# Patient Record
Sex: Male | Born: 1935 | Race: White | Hispanic: No | Marital: Married | Smoking: Former smoker
Health system: Southern US, Community
[De-identification: ages and names within clinical notes are randomized; demographics above are authoritative.]

## PROBLEM LIST (undated history)

## (undated) DIAGNOSIS — I350 Nonrheumatic aortic (valve) stenosis: Secondary | ICD-10-CM

## (undated) DIAGNOSIS — J449 Chronic obstructive pulmonary disease, unspecified: Secondary | ICD-10-CM

## (undated) DIAGNOSIS — I491 Atrial premature depolarization: Secondary | ICD-10-CM

## (undated) DIAGNOSIS — E2740 Unspecified adrenocortical insufficiency: Secondary | ICD-10-CM

## (undated) DIAGNOSIS — Z77090 Contact with and (suspected) exposure to asbestos: Secondary | ICD-10-CM

## (undated) HISTORY — PX: TONSILLECTOMY: SHX5217

## (undated) HISTORY — DX: Nonrheumatic aortic (valve) stenosis: I35.0

## (undated) HISTORY — DX: Atrial premature depolarization: I49.1

## (undated) HISTORY — PX: HERNIA REPAIR: SHX51

## (undated) HISTORY — DX: Chronic obstructive pulmonary disease, unspecified: J44.9

## (undated) HISTORY — DX: Unspecified adrenocortical insufficiency: E27.40

## (undated) HISTORY — DX: Contact with and (suspected) exposure to asbestos: Z77.090

---

## 1997-10-31 ENCOUNTER — Ambulatory Visit (HOSPITAL_COMMUNITY): Admission: RE | Admit: 1997-10-31 | Discharge: 1997-10-31 | Payer: Self-pay | Admitting: Internal Medicine

## 1998-01-25 ENCOUNTER — Other Ambulatory Visit: Admission: RE | Admit: 1998-01-25 | Discharge: 1998-01-25 | Payer: Self-pay | Admitting: Internal Medicine

## 1999-01-16 ENCOUNTER — Observation Stay (HOSPITAL_COMMUNITY): Admission: AD | Admit: 1999-01-16 | Discharge: 1999-01-17 | Payer: Self-pay | Admitting: Interventional Cardiology

## 2000-01-28 ENCOUNTER — Ambulatory Visit (HOSPITAL_COMMUNITY): Admission: RE | Admit: 2000-01-28 | Discharge: 2000-01-28 | Payer: Self-pay | Admitting: Internal Medicine

## 2000-11-13 ENCOUNTER — Encounter: Admission: RE | Admit: 2000-11-13 | Discharge: 2000-11-13 | Payer: Self-pay | Admitting: Gastroenterology

## 2000-11-13 ENCOUNTER — Encounter: Payer: Self-pay | Admitting: Gastroenterology

## 2000-12-03 ENCOUNTER — Encounter: Admission: RE | Admit: 2000-12-03 | Discharge: 2000-12-03 | Payer: Self-pay | Admitting: Surgery

## 2000-12-03 ENCOUNTER — Encounter: Payer: Self-pay | Admitting: Surgery

## 2000-12-04 ENCOUNTER — Ambulatory Visit (HOSPITAL_BASED_OUTPATIENT_CLINIC_OR_DEPARTMENT_OTHER): Admission: RE | Admit: 2000-12-04 | Discharge: 2000-12-04 | Payer: Self-pay | Admitting: Surgery

## 2001-01-09 ENCOUNTER — Encounter: Payer: Self-pay | Admitting: Emergency Medicine

## 2001-01-09 ENCOUNTER — Emergency Department (HOSPITAL_COMMUNITY): Admission: EM | Admit: 2001-01-09 | Discharge: 2001-01-09 | Payer: Self-pay | Admitting: Emergency Medicine

## 2001-07-01 ENCOUNTER — Encounter: Admission: RE | Admit: 2001-07-01 | Discharge: 2001-07-01 | Payer: Self-pay | Admitting: Internal Medicine

## 2001-07-01 ENCOUNTER — Encounter: Payer: Self-pay | Admitting: Internal Medicine

## 2003-01-13 ENCOUNTER — Encounter: Payer: Self-pay | Admitting: *Deleted

## 2003-01-13 ENCOUNTER — Emergency Department (HOSPITAL_COMMUNITY): Admission: EM | Admit: 2003-01-13 | Discharge: 2003-01-13 | Payer: Self-pay | Admitting: *Deleted

## 2003-01-19 ENCOUNTER — Encounter: Payer: Self-pay | Admitting: Gastroenterology

## 2003-01-19 ENCOUNTER — Encounter: Admission: RE | Admit: 2003-01-19 | Discharge: 2003-01-19 | Payer: Self-pay | Admitting: Gastroenterology

## 2004-08-08 ENCOUNTER — Ambulatory Visit: Payer: Self-pay | Admitting: Internal Medicine

## 2004-08-21 ENCOUNTER — Encounter: Admission: RE | Admit: 2004-08-21 | Discharge: 2004-08-21 | Payer: Self-pay | Admitting: Neurology

## 2004-11-11 ENCOUNTER — Ambulatory Visit: Payer: Self-pay | Admitting: Internal Medicine

## 2004-12-10 ENCOUNTER — Ambulatory Visit: Payer: Self-pay | Admitting: Internal Medicine

## 2005-01-28 ENCOUNTER — Ambulatory Visit: Payer: Self-pay | Admitting: Internal Medicine

## 2005-06-06 ENCOUNTER — Ambulatory Visit: Payer: Self-pay | Admitting: Internal Medicine

## 2005-08-08 ENCOUNTER — Ambulatory Visit: Payer: Self-pay | Admitting: Internal Medicine

## 2005-11-03 ENCOUNTER — Ambulatory Visit: Payer: Self-pay | Admitting: Internal Medicine

## 2005-11-18 ENCOUNTER — Ambulatory Visit: Payer: Self-pay | Admitting: Internal Medicine

## 2005-12-02 ENCOUNTER — Ambulatory Visit: Payer: Self-pay | Admitting: Critical Care Medicine

## 2005-12-16 ENCOUNTER — Ambulatory Visit: Payer: Self-pay | Admitting: Internal Medicine

## 2005-12-30 ENCOUNTER — Ambulatory Visit: Payer: Self-pay | Admitting: Internal Medicine

## 2006-01-13 ENCOUNTER — Ambulatory Visit: Payer: Self-pay | Admitting: Internal Medicine

## 2006-01-27 ENCOUNTER — Ambulatory Visit: Payer: Self-pay | Admitting: Internal Medicine

## 2006-02-02 ENCOUNTER — Ambulatory Visit: Payer: Self-pay | Admitting: Internal Medicine

## 2006-02-10 ENCOUNTER — Ambulatory Visit: Payer: Self-pay | Admitting: Internal Medicine

## 2006-03-11 ENCOUNTER — Ambulatory Visit: Payer: Self-pay | Admitting: Internal Medicine

## 2006-04-08 ENCOUNTER — Ambulatory Visit: Payer: Self-pay | Admitting: Internal Medicine

## 2006-04-21 ENCOUNTER — Ambulatory Visit: Payer: Self-pay | Admitting: Internal Medicine

## 2006-05-05 ENCOUNTER — Ambulatory Visit: Payer: Self-pay | Admitting: Internal Medicine

## 2006-05-12 ENCOUNTER — Ambulatory Visit: Payer: Self-pay | Admitting: Internal Medicine

## 2006-05-19 ENCOUNTER — Ambulatory Visit: Payer: Self-pay | Admitting: Internal Medicine

## 2006-06-02 ENCOUNTER — Ambulatory Visit: Payer: Self-pay | Admitting: Internal Medicine

## 2006-06-18 ENCOUNTER — Ambulatory Visit: Payer: Self-pay | Admitting: Internal Medicine

## 2006-08-07 ENCOUNTER — Encounter: Payer: Self-pay | Admitting: Internal Medicine

## 2006-08-14 ENCOUNTER — Ambulatory Visit: Payer: Self-pay | Admitting: Internal Medicine

## 2006-08-24 ENCOUNTER — Ambulatory Visit: Payer: Self-pay | Admitting: Internal Medicine

## 2006-09-07 ENCOUNTER — Ambulatory Visit: Payer: Self-pay | Admitting: Internal Medicine

## 2006-09-21 ENCOUNTER — Ambulatory Visit: Payer: Self-pay | Admitting: Internal Medicine

## 2006-10-05 ENCOUNTER — Ambulatory Visit: Payer: Self-pay | Admitting: Internal Medicine

## 2006-10-13 ENCOUNTER — Ambulatory Visit: Payer: Self-pay | Admitting: Internal Medicine

## 2006-10-19 ENCOUNTER — Ambulatory Visit: Payer: Self-pay | Admitting: Internal Medicine

## 2006-11-02 ENCOUNTER — Ambulatory Visit: Payer: Self-pay | Admitting: Internal Medicine

## 2006-11-17 ENCOUNTER — Ambulatory Visit: Payer: Self-pay | Admitting: Internal Medicine

## 2006-12-01 ENCOUNTER — Ambulatory Visit: Payer: Self-pay | Admitting: Internal Medicine

## 2006-12-08 ENCOUNTER — Ambulatory Visit: Payer: Self-pay | Admitting: Internal Medicine

## 2006-12-16 ENCOUNTER — Ambulatory Visit: Payer: Self-pay | Admitting: Internal Medicine

## 2007-01-04 ENCOUNTER — Ambulatory Visit: Payer: Self-pay | Admitting: Internal Medicine

## 2007-01-18 ENCOUNTER — Ambulatory Visit: Payer: Self-pay | Admitting: Internal Medicine

## 2007-02-01 ENCOUNTER — Ambulatory Visit: Payer: Self-pay | Admitting: Internal Medicine

## 2007-02-16 ENCOUNTER — Ambulatory Visit: Payer: Self-pay | Admitting: Internal Medicine

## 2007-03-02 ENCOUNTER — Ambulatory Visit: Payer: Self-pay | Admitting: Internal Medicine

## 2007-03-16 ENCOUNTER — Ambulatory Visit: Payer: Self-pay | Admitting: Internal Medicine

## 2007-05-11 ENCOUNTER — Ambulatory Visit: Payer: Self-pay | Admitting: Internal Medicine

## 2007-05-20 ENCOUNTER — Telehealth: Payer: Self-pay | Admitting: Internal Medicine

## 2007-05-25 ENCOUNTER — Ambulatory Visit: Payer: Self-pay | Admitting: Internal Medicine

## 2007-06-07 ENCOUNTER — Ambulatory Visit: Payer: Self-pay | Admitting: Internal Medicine

## 2007-06-08 DIAGNOSIS — J4489 Other specified chronic obstructive pulmonary disease: Secondary | ICD-10-CM | POA: Insufficient documentation

## 2007-06-08 DIAGNOSIS — J309 Allergic rhinitis, unspecified: Secondary | ICD-10-CM | POA: Insufficient documentation

## 2007-06-08 DIAGNOSIS — J449 Chronic obstructive pulmonary disease, unspecified: Secondary | ICD-10-CM

## 2007-06-08 DIAGNOSIS — N259 Disorder resulting from impaired renal tubular function, unspecified: Secondary | ICD-10-CM | POA: Insufficient documentation

## 2007-06-08 DIAGNOSIS — J330 Polyp of nasal cavity: Secondary | ICD-10-CM

## 2007-06-08 DIAGNOSIS — J45909 Unspecified asthma, uncomplicated: Secondary | ICD-10-CM | POA: Insufficient documentation

## 2007-06-08 DIAGNOSIS — J329 Chronic sinusitis, unspecified: Secondary | ICD-10-CM | POA: Insufficient documentation

## 2007-06-12 DIAGNOSIS — E273 Drug-induced adrenocortical insufficiency: Secondary | ICD-10-CM

## 2007-06-21 ENCOUNTER — Ambulatory Visit: Payer: Self-pay | Admitting: Internal Medicine

## 2007-07-15 ENCOUNTER — Ambulatory Visit: Payer: Self-pay | Admitting: Internal Medicine

## 2007-08-25 ENCOUNTER — Telehealth (INDEPENDENT_AMBULATORY_CARE_PROVIDER_SITE_OTHER): Payer: Self-pay | Admitting: *Deleted

## 2007-10-13 ENCOUNTER — Telehealth: Payer: Self-pay | Admitting: Internal Medicine

## 2007-11-18 ENCOUNTER — Encounter: Payer: Self-pay | Admitting: Internal Medicine

## 2007-12-14 ENCOUNTER — Ambulatory Visit: Payer: Self-pay | Admitting: Internal Medicine

## 2008-03-23 ENCOUNTER — Encounter: Admission: RE | Admit: 2008-03-23 | Discharge: 2008-03-23 | Payer: Self-pay | Admitting: Internal Medicine

## 2008-03-30 ENCOUNTER — Encounter: Admission: RE | Admit: 2008-03-30 | Discharge: 2008-03-30 | Payer: Self-pay | Admitting: Internal Medicine

## 2008-04-06 ENCOUNTER — Encounter: Admission: RE | Admit: 2008-04-06 | Discharge: 2008-04-06 | Payer: Self-pay | Admitting: Gastroenterology

## 2008-05-09 ENCOUNTER — Ambulatory Visit: Payer: Self-pay | Admitting: Internal Medicine

## 2008-05-10 ENCOUNTER — Telehealth: Payer: Self-pay | Admitting: Internal Medicine

## 2008-05-29 ENCOUNTER — Encounter: Payer: Self-pay | Admitting: Internal Medicine

## 2008-10-03 ENCOUNTER — Ambulatory Visit: Payer: Self-pay | Admitting: Internal Medicine

## 2008-10-17 ENCOUNTER — Encounter: Payer: Self-pay | Admitting: Internal Medicine

## 2008-10-20 ENCOUNTER — Encounter: Payer: Self-pay | Admitting: Internal Medicine

## 2008-12-04 ENCOUNTER — Ambulatory Visit: Payer: Self-pay | Admitting: Internal Medicine

## 2008-12-06 DIAGNOSIS — J45909 Unspecified asthma, uncomplicated: Secondary | ICD-10-CM | POA: Insufficient documentation

## 2008-12-18 ENCOUNTER — Ambulatory Visit: Payer: Self-pay | Admitting: Internal Medicine

## 2009-01-02 ENCOUNTER — Ambulatory Visit: Payer: Self-pay | Admitting: Internal Medicine

## 2009-01-16 ENCOUNTER — Ambulatory Visit: Payer: Self-pay | Admitting: Internal Medicine

## 2009-01-30 ENCOUNTER — Ambulatory Visit: Payer: Self-pay | Admitting: Internal Medicine

## 2009-02-15 ENCOUNTER — Ambulatory Visit: Payer: Self-pay | Admitting: Internal Medicine

## 2009-02-28 ENCOUNTER — Ambulatory Visit: Payer: Self-pay | Admitting: Internal Medicine

## 2009-03-13 ENCOUNTER — Telehealth: Payer: Self-pay | Admitting: Internal Medicine

## 2009-06-04 ENCOUNTER — Ambulatory Visit: Payer: Self-pay | Admitting: Internal Medicine

## 2009-06-11 ENCOUNTER — Telehealth (INDEPENDENT_AMBULATORY_CARE_PROVIDER_SITE_OTHER): Payer: Self-pay | Admitting: *Deleted

## 2009-09-11 ENCOUNTER — Telehealth (INDEPENDENT_AMBULATORY_CARE_PROVIDER_SITE_OTHER): Payer: Self-pay | Admitting: *Deleted

## 2009-09-14 ENCOUNTER — Telehealth: Payer: Self-pay | Admitting: Internal Medicine

## 2009-11-22 ENCOUNTER — Encounter: Admission: RE | Admit: 2009-11-22 | Discharge: 2009-11-22 | Payer: Self-pay | Admitting: Internal Medicine

## 2009-11-24 ENCOUNTER — Encounter: Admission: RE | Admit: 2009-11-24 | Discharge: 2009-11-24 | Payer: Self-pay | Admitting: Internal Medicine

## 2009-12-03 ENCOUNTER — Ambulatory Visit: Payer: Self-pay | Admitting: Internal Medicine

## 2009-12-03 DIAGNOSIS — I491 Atrial premature depolarization: Secondary | ICD-10-CM

## 2010-02-04 ENCOUNTER — Ambulatory Visit: Payer: Self-pay | Admitting: Internal Medicine

## 2010-02-04 DIAGNOSIS — R0602 Shortness of breath: Secondary | ICD-10-CM | POA: Insufficient documentation

## 2010-02-04 DIAGNOSIS — T6391XA Toxic effect of contact with unspecified venomous animal, accidental (unintentional), initial encounter: Secondary | ICD-10-CM | POA: Insufficient documentation

## 2010-07-20 ENCOUNTER — Encounter: Payer: Self-pay | Admitting: Neurology

## 2010-07-30 NOTE — Miscellaneous (Signed)
Summary: Injection Record / [facility]    Injection Record / [facility]    Imported By: Lennie Odor 11/19/2009 14:44:27  _____________________________________________________________________  External Attachment:    Type:   Image     Comment:   External Document

## 2010-07-30 NOTE — Letter (Signed)
Summary: Statement of Medical Necessity/ [facility]  Statement of Medical Necessity/ [facility]   Imported By: Lennie Odor 11/19/2009 17:14:50  _____________________________________________________________________  External Attachment:    Type:   Image     Comment:   External Document

## 2010-07-30 NOTE — Progress Notes (Signed)
Summary: rx  Phone Note Call from Patient Call back at Home Phone [phone]   Caller: Spouse-Nancy Call For: young Reason for Call: Talk to Nurse Summary of Call: cough, congestion, settling in pt's chest.  Can you call in something? [facility] [address] Initial call taken by: Eugene Gavia,  September 11, 2009 3:02 PM  Follow-up for Phone Call        called and spoke with pt.  pt  states symptoms started yesterday.  pt c/o chest congestion, coughing up clear to yellow colored sputum, chest sore d/t increased coughing, head congestion with yellow nasal drainage, wheezing and increased sob.  pt denied fever.  pt states he is taking mucinex.  pt requests frx for Tussionex and abx. Please advise.  Thanks.  Aundra Millet Reynolds LPN  September 11, 2009 3:32 PM   NKDA  Additional Follow-up for Phone Call Additional follow up Details #1::        per CDY: okay for augmentin 875mg  #14 1 by mouth two times a day and tussionex 1 tsp two times a day as needed.  augmentin sent electronically and tussionex called into pt's pharmacy of choice.  pt is aware. Boone Master CNA  September 11, 2009 5:46 PM     New/Updated Medications: AUGMENTIN 875-125 MG TABS (AMOXICILLIN-POT CLAVULANATE) Take 1 tablet by mouth two times a day x7days Prescriptions: AUGMENTIN 875-125 MG TABS (AMOXICILLIN-POT CLAVULANATE) Take 1 tablet by mouth two times a day x7days  #14 x 0   Entered by:   Boone Master CNA   Authorized by:   Waymon Budge MD   Signed by:   Boone Master CNA on 09/11/2009   Method used:   Electronically to        [facility] [phone]* (retail)       [address]       [place], [place]  [address]       Ph: [phone] or [phone]       Fax: [phone]   RxID:   [id number]

## 2010-07-30 NOTE — Assessment & Plan Note (Signed)
Summary: rov 6 months///kp   Primary Provider/Referring Provider:  Burney Gauze  CC:  6 month followup.  Pt c/o SOB progressively getting worse since last seen.  He states that he gets tired easily.  He ran out of advair 6 wks ago and did not renew rx..  History of Present Illness:  12/04/08- Asthma/ COPD, asbestos exposure, adrenal insufficiency Says approval obtained for Xolair, to start today.  Main c/o is weakness, easily tired. Peak flow around 300-350. max was 450 while on heavy pednisone dose. Some wheeze and occasionally cough with white sputum. Denies chest pain, fever, edema. Feet have swollen at times in past. Balance not good- he suspects slight stroke in the past. No blood or purulent discharge.  June 04, 2009- Asthma/ COPD, asbestos exposure, adrenal insufficiency Episodes of "tiredness". Despite maintenance prednisone, he wonders if this could be adrenal insufficiency. He says Dr Donette Larry checked broad blood work "OK". He asks about a CXR. Occasional wheeze but not much. Since Xolair? coincidence? He has noted more aching in thighs and knees on  waking. Little ankle edema- rarely takes diuretic. Not having much active airway spasm- we talked about how much of his disease is reactive/ controlled with xolair, vs COPD. had flu vax.  December 03, 2009- Asthma/ COPD. , adrenal insufficiency, ? asbestos exposure CXR reviewed- pleural calcification c/w hx asbestos exposure. He had worked with "mineral wool".  NAD. Complains he lacks energy. Thinks exercise tolerance has gradually declined. He tries to exercise treadmill and gym. Balance isn't as good.  Used to get peak flow 450 a couple of years ago and gradually now only about 350. He thinks he lost some after a bronchitis this winter. Denies cough. Admits ran out of Advair 6-8 weeks ago and remained off, unsure if it helped. He has a sample of Symbicort 160 from Dr Donette Larry- not yet tried. Denies productive cough, chest pain, palpitation.     Current Medications (verified): 1)  Proair Hfa 108 (90 Base) Mcg/act Aers (Albuterol Sulfate) .... 2 Puffs Four Times A Day As Needed 2)  Prednisone 5 Mg Tabs (Prednisone) .... 2 Daily or As Directed 3)  Fexofenadine Hcl 60 Mg  Tabs (Fexofenadine Hcl) .Marland Kitchen.. 1 Twice Daily If Needed For Allergy 4)  Hytrin 5 Mg  Caps (Terazosin Hcl) .... Take 1 Tablet By Mouth Once A Day 5)  Zocor 20 Mg  Tabs (Simvastatin) .... Take 1 Tablet By Mouth Once A Day 6)  Xanax 0.5 Mg  Tabs (Alprazolam) .... Take 1 Tablet By Mouth Once A Day 7)  Ipratropium Bromide 0.02 % Soln (Ipratropium Bromide) .... Inhale 1 Vial Via Hhn 1-2 Times Daily 8)  Albuterol Sulfate (2.5 Mg/59ml) 0.083% Nebu (Albuterol Sulfate) .... Inhale 1 Vial Via Hhn 1-2 Times Daily 9)  Nasonex 50 Mcg/act Susp (Mometasone Furoate) .Marland Kitchen.. 1-2 Puffs Each Nostril Daily 10)  Epipen 0.3 Mg/0.52ml (1:1000) Devi (Epinephrine Hcl (Anaphylaxis)) .... For Severe Allergic Reaction  Allergies (verified): No Known Drug Allergies  Past History:  Past Surgical History: Last updated: 12/04/2008 Tonsillectomy hernia repair  Family History: Last updated: 2008-10-13 Father- died of heart disease Mother -died Alzheimers  Social History: Last updated: 12/03/2009 Patient states former smoker.  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 hx, asbestos exposure adrenal insufficiency allergic rhinitis chronic sinusitis nasal polyps PAC's- EKG 12/03/09  Social History: Patient states former smoker.  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  The patient denies shortness of breath at rest, productive cough, non-productive cough, coughing up blood, chest  pain, acid heartburn, indigestion, loss of appetite, weight change, abdominal pain, difficulty swallowing, sore throat, tooth/dental problems, headaches, nasal congestion/difficulty breathing through nose, and sneezing.    Vital Signs:  Patient profile:   77 year old male Weight:      201 pounds O2 Sat:      93 % on Room air Pulse rate:   43 / minute BP sitting:   110 / 66  (left arm)  Vitals Entered By: Vernie Murders (December 03, 2009 1:40 PM)  O2 Flow:  Room air  Physical Exam  Additional Exam:  General: A/Ox3; pleasant and cooperative, NAD, older- not strong SKIN: no rash, lesions NODES: no lymphadenopathy HEENT: [place]/AT, EOM- WNL, Conjuctivae- clear, PERRLA, TM-WNL, Nose- stuffy, . , Throat- clear and wnl NECK: Supple w/ fair ROM, JVD- none, normal carotid impulses w/o bruits Thyroid-  CHEST: distant no cough or wheeze. No dullness, unlabored. HEART: bigeminal pulse., no m/g/r heard ABDOMEN:  EAV:WUJW, pulse slightly irregular, no edema  NEURO: Grossly intact to observation, fine tremor hands      CXR  Procedure date:  06/04/2009  Findings:        CHEST - 2 VIEW   Comparison: 04/05/2002.   Findings: Chronic bilateral probable calcified pleural plaques, most pronounced in the anterior right upper hemithorax and along the surface of the diaphragm.  These are not significantly changed. Stable lung volumes.  Stable cardiac size and mediastinal contours. No pneumothorax, pulmonary edema, pleural effusion, consolidation, or acute airspace opacity is identified. Visualized tracheal air column is within normal limits.  Stable visualized osseous structures.   IMPRESSION: 1.  Chronic bilateral calcified pleural plaques appear not significantly changed since 2003.  This could be sequelae of asbestosis. 2. No acute cardiopulmonary abnormality.   Read By:  Augusto Gamble,  M.D.     Released By:  Augusto Gamble,   M.D.  _____________________________________________________________________   Impression & Recommendations:  Problem # 1:  SUPRAVENTRICULAR PREMATURE BEATS (ICD-427.61)  Frequent PAC's documented today - will send EKG to his cardiologist for the record. He complains of lack of energy, but this is not new. He denies angina, syncope or fluid retention.  Problem # 2:  COPD (ICD-496) Exercise tolerance is gradually decliniing. I encouraged him to continue some exercise- walking- to maintain endurance. Will update PFT  Problem # 3:  ASBESTOS EXPOSURE, HX OF (ICD-V15.84) CXR remains consistent with pleural plaques without new process. 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  Other Orders: Est. Patient Level IV (11914)  Patient Instructions: 1)  An ECG has been done and reviewed- frequent PAC's. 2)  Copy sent to: Dr Amil Amen and Dr Donette Larry with copy of EKG 3)  Schedule PFT with 6 MWT 4)  Please schedule a follow-up appointment in 2 months.   CXR  Procedure date:  06/04/2009  Findings:        CHEST - 2 VIEW   Comparison: 04/05/2002.   Findings: Chronic bilateral probable calcified pleural plaques, most pronounced in the anterior right upper hemithorax and along  the surface of the diaphragm.  These are not significantly changed. Stable lung volumes.  Stable cardiac size and mediastinal contours. No pneumothorax, pulmonary edema, pleural effusion, consolidation, or acute airspace opacity is identified. Visualized tracheal air column is within normal limits.  Stable visualized osseous structures.   IMPRESSION: 1.  Chronic bilateral calcified pleural plaques appear not significantly changed since 2003.  This could be sequelae of asbestosis. 2. No acute cardiopulmonary abnormality.   Read By:  Augusto Gamble,  M.D.     Released By:  Augusto Gamble,   M.D.  _____________________________________________________________________     Card[facility]Perfect ECG  ID: [id number] Patient: [name] [name] DOB: 1927-10-07 Age: 77 Years Old Sex: Male Race: White Physician: Maple Hudson, Technician: Renold Genta RCP, LPN Height: 71 Weight: 201 Status: Unconfirmed Past Medical History:  asthma/copd asbestos exposure adrenal insufficiency allergic rhinitis chronnic sinusitis nasal polyps Recorded: 12/03/2009 2:12 PM P/PR: 120 ms / 158 ms - Heart rate (maximum exercise) QRS: 75 QT/QTc/QTd: 372 ms / 393 ms / 206 ms - Heart rate (maximum exercise)  P/QRS/T axis: 69 deg / 0 deg / 65 deg - Heart rate (maximum exercise)  Heartrate: 72 bpm  Interpretation:   sinus arrhythmia   Normal variant of ECG

## 2010-07-30 NOTE — Miscellaneous (Signed)
Summary: Orders Update-pft charges//jwr  Clinical Lists Changes  Orders: Added new Service order of Carbon Monoxide diffusing w/capacity (94720) - Signed Added new Service order of Lung Volumes (94240) - Signed Added new Service order of Spirometry (Pre & Post) (94060) - Signed 

## 2010-07-30 NOTE — Assessment & Plan Note (Signed)
Summary: ROV/ MBW   Primary Provider/Referring Provider:  Burney Gauze  CC:  follow up visit after PFT and . Needs EpiPen RX refill.  History of Present Illness:  June 04, 2009- Asthma/ COPD, asbestos exposure, adrenal insufficiency Episodes of "tiredness". Despite maintenance prednisone, he wonders if this could be adrenal insufficiency. He says Dr Donette Larry checked broad blood work "OK". He asks about a CXR. Occasional wheeze but not much. Since Xolair? coincidence? He has noted more aching in thighs and knees on  waking. Little ankle edema- rarely takes diuretic. Not having much active airway spasm- we talked about how much of his disease is reactive/ controlled with xolair, vs COPD. had flu vax.  December 03, 2009- Asthma/ COPD. , adrenal insufficiency, ? asbestos exposure CXR reviewed- pleural calcification c/w hx asbestos exposure. He had worked with "mineral wool".  NAD. Complains he lacks energy. Thinks exercise tolerance has gradually declined. He tries to exercise treadmill and gym. Balance isn't as good.  Used to get peak flow 450 a couple of years ago and gradually now only about 350. He thinks he lost some after a bronchitis this winter. Denies cough. Admits ran out of Advair 6-8 weeks ago and remained off, unsure if it helped. He has a sample of Symbicort 160 from Dr Donette Larry- not yet tried. Denies productive cough, chest pain, palpitation.   February 04, 2010- Asthma/ COPD, adrenal insufficiency, ? asbestos exposure Says he feels lousy due to easy fatigue/ lack of energy. This seems worse to him, but is a chronic complaint. His PF meter runs around 300-350. Dr Horald Pollen hasn't seen him recently for his known adrenal insufficiency,  but left him on prednisone 10 mg daily  through Dr Rene Paci. Denies chest pain, cough, chest congestion. Little palpitation feet swell a little.  PFT-Moderate obstruction, insignificant response to bronchodilator; hyperinflation with airtrapping- question leak,  normal Diffusion. Compared with 2006.- EKG had previously shown frequent PACs.  Cardiolyte study by Dr Amil Amen didn't show a problem as recalled by Mr Jolliff.    Asthma History    Initial Asthma Severity Rating:    Age range: 12+ years    Symptoms: daily    Nighttime Awakenings: 0-2/month    Interferes w/ normal activity: minor limitations    SABA use (not for EIB): daily    Asthma Severity Assessment: Moderate Persistent   Preventive Screening-Counseling & Management  Alcohol-Tobacco     Smoking Status: quit     Year Quit: 1975     Pack years: 15 years  1/2 pack daily  Current Medications (verified): 1)  Proair Hfa 108 (90 Base) Mcg/act Aers (Albuterol Sulfate) .... 2 Puffs Four Times A Day As Needed 2)  Prednisone 5 Mg Tabs (Prednisone) .... 2 Daily or As Directed 3)  Fexofenadine Hcl 60 Mg  Tabs (Fexofenadine Hcl) .Marland Kitchen.. 1 Twice Daily If Needed For Allergy 4)  Hytrin 5 Mg  Caps (Terazosin Hcl) .... Take 1 Tablet By Mouth Once A Day 5)  Zocor 20 Mg  Tabs (Simvastatin) .... Take 1 Tablet By Mouth Once A Day 6)  Xanax 0.5 Mg  Tabs (Alprazolam) .... Take 1 Tablet By Mouth Once A Day 7)  Ipratropium Bromide 0.02 % Soln (Ipratropium Bromide) .... Inhale 1 Vial Via Hhn 1-2 Times Daily 8)  Albuterol Sulfate (2.5 Mg/86ml) 0.083% Nebu (Albuterol Sulfate) .... Inhale 1 Vial Via Hhn 1-2 Times Daily 9)  Nasonex 50 Mcg/act Susp (Mometasone Furoate) .Marland Kitchen.. 1-2 Puffs Each Nostril Daily 10)  Epipen  0.3 Mg/0.70ml (1:1000) Devi (Epinephrine Hcl (Anaphylaxis)) .... For Severe Allergic Reaction  Allergies (verified): No Known Drug Allergies  Past History:  Past Medical History: Last updated: 12/03/2009 asthma/copd  ? hx, asbestos exposure adrenal insufficiency allergic rhinitis chronic sinusitis nasal polyps PAC's- EKG 12/03/09  Past Surgical History: Last updated: 12/04/2008 Tonsillectomy hernia repair  Family History: Last updated: 10/19/08 Father- died of heart  disease Mother -died Alzheimers  Social History: Last updated: 12/03/2009 Patient states former smoker.  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  The patient denies shortness of breath at rest, productive cough, non-productive cough, coughing up blood, chest pain, irregular heartbeats, acid heartburn, indigestion, loss of appetite, weight change, abdominal pain, difficulty swallowing, sore throat, tooth/dental problems, headaches, nasal congestion/difficulty breathing through nose, and sneezing.    Vital Signs:  Patient profile:   77 year old male Height:      71 inches Weight:      203 pounds BMI:     28.42 O2 Sat:      90 % on Room air Pulse rate:   74 / minute BP sitting:   108 / 68  (left arm) Cuff size:   regular  Vitals Entered By: Reynaldo Minium CMA (February 04, 2010 11:40 AM)  O2 Flow:  Room air CC: follow up visit after PFT and . Needs EpiPen RX refill   Physical Exam  Additional Exam:  General: A/Ox3; pleasant and cooperative, NAD, older- not strong. Affect is always somewhat depressed. SKIN: no rash, lesions NODES: no lymphadenopathy HEENT: /AT, EOM- WNL, Conjuctivae- clear, PERRLA, chronic periorbital edema, TM-WNL, Nose- stuffy,  Throat- clear and wnl NECK: Supple w/ fair ROM, JVD- none, normal carotid impulses w/o bruits Thyroid-  CHEST: distant, no cough or wheeze. No dullness, unlabored. HEART: regular pulse., no m/g/r heard ABDOMEN: not heavy NFA:OZHY, , no edema , cyanosis or clubbing NEURO: Grossly intact to observation, fine tremor hands      Impression & Recommendations:  Problem # 1:  COPD (ICD-496) Discussed exercise effects and conditioning. His pulmonary function status shows age related progression, but I can't say that there has been a lot  of deterioration otherwise. We compared current results to 2006. I suggested he see Dr Horald Pollen again for an upodate on his adrenal function, asking if his lack of energy is due to adrenal insufficinecy despite his maintenance prednisone. Depression is likely. 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 In past he had near death from anaphyllaxis to sequential yellow jacket stings. Subsequently stung without problems. I will provide Epipen, but hopefuly his situation was acutely sensitized byt eh sequential stings in consecutive days.  Orders: Est. Patient Level IV (86578)  Problem # 3:  ? of ASBESTOS EXPOSURE, HX OF (ICD-V15.84)  We talked again about pleural plaques on CXR. His exposure was to "mineral wool"- which may have been asbestos. We discussed evaluation. He is not eager to pursue the issue. I will try to watch him as for asbestos surveillance as discussed with him.  Patient Instructions: 1)  Please schedule a follow-up appointment in 6 months. 2)  Epipen refilled 3)  Sample Advair 100/50: 1 puff and rinse mouth twice  daily 4)  Sample Symbicort 80/ 4.5: 2 puffs and rinse mouth twice daily 5)  Compare Symbicort and Advair. If either helps, pick the best one and call for script. Prescriptions: EPIPEN 0.3 MG/0.3ML (1:1000) DEVI (EPINEPHRINE HCL (ANAPHYLAXIS)) For severe allergic reaction  #1 x prn   Entered and Authorized by:   Waymon Budge MD   Signed by:   Waymon Budge MD on 02/04/2010   Method used:   Print then Give to Patient   RxID:   [id number]

## 2010-07-30 NOTE — Progress Notes (Signed)
Summary: bronchitis  Phone Note Call from Patient   Caller: Patient Call For: Devaughn Savant Summary of Call: pt think he have bronchitis. prednisone not helping a lot Initial call taken by: Rickard Patience,  September 14, 2009 4:53 PM  Follow-up for Phone Call        called and spoke with pt.  pt states he was just started on Augmentin 875 two times a day on 09/11/2009.  pt states he increased his prednisone on his own and took 40mg  yesterday and 20mg  today.  Pt c/o "rattling in chest," coughing up yelllow sputum and "has to work hard to breath."  Pt states peak flow meter readings are around 250.  I informed pt abx takes time before he will start to feel better.  Pt still wishes to have CY opinion.  Will forward message to CY to address.  Aundra Millet Reynolds LPN  September 14, 2009 5:05 PM   Additional Follow-up for Phone Call Additional follow up Details #1::        He thinks he is getting slowly better. Chest congestion and cough began a week ago nonpurulent without sore throat or fever. He had been taking mucinex, prednisone incr to 40 mg/day, antibiotic.  Plan: Expect slow nonspecific improvement. Reduce prednisone by 10 mg every 3-4 days til back at baseline 10 mg daily. Additional Follow-up by: Waymon Budge MD,  September 17, 2009 5:37 PM

## 2010-07-30 NOTE — Assessment & Plan Note (Signed)
Summary: SIX MIN WALK- PULM STRESS TEST  Nurse Visit   Vital Signs:  Patient profile:   77 year old male Pulse rate:   84 / minute BP sitting:   112 / 68  Medications Prior to Update: 1)  Proair Hfa 108 (90 Base) Mcg/act Aers (Albuterol Sulfate) .... 2 Puffs Four Times A Day As Needed 2)  Prednisone 5 Mg Tabs (Prednisone) .... 2 Daily or As Directed 3)  Fexofenadine Hcl 60 Mg  Tabs (Fexofenadine Hcl) .Marland Kitchen.. 1 Twice Daily If Needed For Allergy 4)  Hytrin 5 Mg  Caps (Terazosin Hcl) .... Take 1 Tablet By Mouth Once A Day 5)  Zocor 20 Mg  Tabs (Simvastatin) .... Take 1 Tablet By Mouth Once A Day 6)  Xanax 0.5 Mg  Tabs (Alprazolam) .... Take 1 Tablet By Mouth Once A Day 7)  Ipratropium Bromide 0.02 % Soln (Ipratropium Bromide) .... Inhale 1 Vial Via Hhn 1-2 Times Daily 8)  Albuterol Sulfate (2.5 Mg/27ml) 0.083% Nebu (Albuterol Sulfate) .... Inhale 1 Vial Via Hhn 1-2 Times Daily 9)  Nasonex 50 Mcg/act Susp (Mometasone Furoate) .Marland Kitchen.. 1-2 Puffs Each Nostril Daily 10)  Epipen 0.3 Mg/0.57ml (1:1000) Devi (Epinephrine Hcl (Anaphylaxis)) .... For Severe Allergic Reaction  Allergies: No Known Drug Allergies  Orders Added: 1)  Pulmonary Stress (6 min walk) [94620]   Six Minute Walk Test Medications taken before test(dose and time): 2)  Prednisone 5 Mg Tabs (Prednisone) .... 2 Daily or As Directed Pt took 10mg  this am at 7:30- no other meds taken today per pt. Supplemental oxygen during the test: No  Lap counter(place a tick mark inside a square for each lap completed) lap 1 complete  lap 2 complete   lap 3 complete   lap 4 complete  lap 5 complete  lap 6 complete  lap 7 complete   lap 8 complete   lap 9 complete   Baseline  BP sitting: 112/ 68 Heart rate: 84 Dyspnea ( Borg scale) 0 Fatigue (Borg scale) 0 SPO2 92  End Of Test  BP sitting: 118/ 72 Heart rate: 95 Dyspnea ( Borg scale) 2 Fatigue (Borg scale) 0 SPO2 95  2 Minutes post  BP sitting: 114/ 68 Heart rate: 75 SPO2  93  Stopped or paused before six minutes? No  Interpretation: Number of laps  9 X 48 meters =   432 meters =    432 meters   Total distance walked in six minutes: 432 meters  Tech ID: Tivis Ringer, CNA (February 04, 2010 10:35 AM) Jeremy Johann Comments Pt completed test w/ 0 rest breaks and 0 complaints.

## 2010-08-05 ENCOUNTER — Ambulatory Visit: Payer: Self-pay | Admitting: Internal Medicine

## 2010-09-02 ENCOUNTER — Ambulatory Visit: Payer: Self-pay | Admitting: Internal Medicine

## 2010-09-27 ENCOUNTER — Encounter: Payer: Self-pay | Admitting: Internal Medicine

## 2010-10-01 ENCOUNTER — Encounter: Payer: Self-pay | Admitting: Internal Medicine

## 2010-10-01 ENCOUNTER — Ambulatory Visit (INDEPENDENT_AMBULATORY_CARE_PROVIDER_SITE_OTHER): Payer: Medicare Other | Admitting: Internal Medicine

## 2010-10-01 VITALS — BP 116/72 | HR 82 | Ht 71.0 in | Wt 206.6 lb

## 2010-10-01 DIAGNOSIS — Z87898 Personal history of other specified conditions: Secondary | ICD-10-CM

## 2010-10-01 DIAGNOSIS — J449 Chronic obstructive pulmonary disease, unspecified: Secondary | ICD-10-CM

## 2010-10-01 NOTE — Progress Notes (Signed)
  Subjective:    Patient ID: [name] [name], male    DOB: 20-Aug-1927, 77 y.o.   MRN: [id number]  HPI 77 yo former smoker followed for chronic obstructive asthma, Allergic rhintiis,  COPD, suspected asbestos exposure after working in [employer]. Had seen Dr Everardo All for endocrinology evaluation concerned about adrenal insufficiency after a lot of prednisone therapy in past years. Now remains on prednisone 10 mg daily. Last here 02/04/2010-no major acute problems since last here. He indicates frustration that he can't exert to the level of digging a hole or using a pick. He can walk on treadmill, go to mail box. Denies cough, wheeze, chest pain. Maybe a little palpitation occasionally. Xolair failed to help. Doesn't notice weather or pollen. He notes that he no longer has the asthma flares that he used to- not in a couple of years. Long discussion of steroid therapy, adrenal insufficiency and etc. He goes to gym and leg presses 400 lbs, but complains that legs feel weak.  Peak Flow best was 450 a few years ago, but is 350 now. PFT 02/04/10- FEV1 1.93/ 70%; FEV1/FVC 0.41; insignificant response to bronchodilator, DLCO 95%.     Review of Systems See HPI Constitutional:   No weight loss, night sweats,  Fevers, chills, fatigue, lassitude. HEENT:   No headaches,  Difficulty swallowing,  Tooth/dental problems,  Sore throat,                No sneezing, itching, ear ache, nasal congestion, post nasal drip,   CV:  No chest pain,  Orthopnea, PND, swelling in lower extremities, anasarca, dizziness, palpitations  GI  No heartburn, indigestion, abdominal pain, nausea, vomiting, diarrhea, change in bowel habits, loss of appetite  Resp: .  No excess mucus, no productive cough,  No non-productive cough,  No coughing up of blood.  No change in color of mucus.  No wheezing. Skin: no rash or lesions.  GU: no dysuria, change in color of urine, no urgency or frequency.  No flank pain.  MS:  No joint pain or  swelling.  No decreased range of motion.  No back pain.  Psych:  No change in mood or affect. No depression or anxiety.  No memory loss.      Objective:   Physical Exam General- Alert, Oriented, Affect-appropriate, Distress- none acute  Sturdy appearing  Skin- rash-none, lesions- none, excoriation- none. Bruising forearms.  Lymphadenopathy- none  Head- atraumatic  Eyes- Gross vision intact, PERRLA, conjunctivae clear, secretions  Ears- Normal-  Hearing, canals, Tm ,  R ,  Nose- Clear, No-Septal dev, mucus, polyps, erosion, perforation   Throat- Mallampati II , mucosa clear , drainage- none, tonsils- atrophic  Neck- flexible , trachea midline, no stridor , thyroid nl, carotid no bruit  Chest - symmetrical excursion , unlabored     Heart/CV- RRR , no murmur , no gallop  , no rub, nl s1 s2                     - JVD- none , edema- none, stasis changes- none, varices- none     Lung- few crackles left base, wheeze- none, cough- none , dullness-none, rub- none     Chest wall-  Abd- tender-no, distended-no, bowel sounds-present, HSM- no  Br/ Gen/ Rectal- Not done, not indicated  Extrem- cyanosis- none, clubbing, none, atrophy- none, strength- nl  Neuro- grossly intact to observation        Assessment & Plan:

## 2010-10-01 NOTE — Patient Instructions (Addendum)
Continue exercising for aerobic endurance "cardio" as well as strength. Otherwise I don't have changes to suggest for now - please call as needed.

## 2010-10-05 ENCOUNTER — Encounter: Payer: Self-pay | Admitting: Internal Medicine

## 2010-10-06 NOTE — Assessment & Plan Note (Signed)
Chronic fixed obstructive disease, best characterized as chronic obstructive asthma. He is concerned about decline in exercise tolerance, and has complained for years about lack of energy. An 77 yo man who is concerned that he can't go out and dig or swing a pick, and who talks about doing 400 lb leg presses at the gym, may not be realistic.

## 2010-10-10 ENCOUNTER — Telehealth: Payer: Self-pay | Admitting: Internal Medicine

## 2010-10-10 MED ORDER — ALBUTEROL SULFATE (2.5 MG/3ML) 0.083% IN NEBU: INHALATION_SOLUTION | RESPIRATORY_TRACT | Status: DC

## 2010-10-10 MED ORDER — IPRATROPIUM BROMIDE 0.02 % IN SOLN: RESPIRATORY_TRACT | Status: DC

## 2010-10-10 NOTE — Telephone Encounter (Signed)
Have printed off rx's for cdy to sign so we can fax back to [facility]. Please advise Dr. Maple Hudson  Thanks

## 2010-10-10 NOTE — Telephone Encounter (Signed)
CDY has signed rx's. Called and spoke w/ [facility] to get fax # and was given [phone]. Faxed rx already.

## 2010-11-07 ENCOUNTER — Other Ambulatory Visit: Payer: Self-pay | Admitting: Internal Medicine

## 2010-11-12 NOTE — Assessment & Plan Note (Signed)
[facility]                             PULMONARY OFFICE NOTE   NAME:[name], [name] WEBER                      MRN:          [id number]  DATE:03/16/2007                            DOB:          [birth year]    PROBLEMS:  1. Asthma/chronic obstructive pulmonary disease.  2. Asbestos exposure.  3. Renal insufficiency.  4. Allergic rhinitis.  5. Chronic sinusitis.  6. Nasal polys.   HISTORY OF PRESENT ILLNESS:  He questions if Xolair causes muscle aches,  especially after walking on a treadmill, but volunteers that he is also  on Zocor and recognizes aches are common with that drug.  He started  Xolair and got his treadmill about the same time.  He is not sure if  Xolair helps, but there have been no major recent flareups.  He asks  about Spiriva versus his nebulized Atrovent during the day, and we  discussed this.  He is doing his nebulizer treatments once or twice  every day.  He also remains on prednisone 10 mg daily for his adrenal  insufficiency.  We reviewed his medications.  Xolair 300 mg every two  weeks was restarted in February.   OBJECTIVE:  Weight 205 pounds, blood pressure 158/68, pulse 78, room air  saturation 95%.  Breath sounds are diminished.  Heart sounds regular  without murmur.  Slight tremor.  No edema.   IMPRESSION:  Steroid dependent asthma/COPD.  Muscle aches are pretty  nonspecific.   PLAN:  1. Try dropping Atrovent from his nebulizer treatments and just using      albuterol while adding Spiriva once daily and compare that to his      present schedule.  2. Okay to skip Xolair for 1-2 months and then restart while he tries      to decide if it is helping him.  3. Schedule return in four months, earlier p.r.n.     Clinton D. Maple Hudson, MD, [facility], FACP  Electronically Signed    CDY/MedQ  DD: 03/16/2007  DT: 03/17/2007  Job #: [id number]   cc:   Georgann Housekeeper, MD

## 2010-11-12 NOTE — Assessment & Plan Note (Signed)
[facility]                             PULMONARY OFFICE NOTE   NAME:[name] [name], [name] [name]                      MRN:          [id number]  DATE:12/08/2006                            DOB:          09-06-1927    PROBLEMS:  1. Asthma/chronic obstructive pulmonary disease.  2. Asbestos exposure.  3. Adrenal insufficiency.  4. Allergic rhinitis.  5. Chronic sinusitis.  6. Nasal polyps.   HISTORY:  He feels that his blood pressure drops rather abruptly at  times, making him feel tired.  He has not had fainting episodes.  Minor  lightheadedness sometimes if he stands quickly.  He finds his nebulizer  sufficient used twice a day most days with a maximum peak flow usually  around 470.  He says he walked this morning 2-and-a-half miles on his  treadmill.  There have been no sudden events.  I talked with him again  about his adrenal status, as that may contribute to some of his episodes  of feeling a little lightheaded.  We reviewed his medication list which  is charted.  He continues the Xolair 300 mg injected every 2 weeks.  He  has clearly been more stable in the last 6 months or a year than he was  for a long time before that.   OBJECTIVE:  Weight 201 pounds, BP 138/58, pulse 68, room air saturation  94%.  Slight tremor.  Heart sounds regular, no murmur or gallop heard.  I do not hear a wheeze.  Breath sounds are diminished.  Expiratory phase  is slow.  There is no dullness, no pleural rub.  I find no adenopathy.  There is 1+ ankle edema bilaterally without cyanosis.   IMPRESSION:  Stable chronic obstructive pulmonary disease.  Asthma  component is fairly well controlled.  There is probable mild cor  pulmonale.   PLAN:  He will follow closely with Dr. Donette Larry for primary care.  I  raised the possibility he might become a candidate for Florinef.  Continue Xolair.  Schedule return 4 months, but earlier p.r.n.     Clinton D. Maple Hudson, MD, [facility], FACP  Electronically Signed    CDY/MedQ  DD: 12/09/2006  DT: 12/10/2006  Job #: [id number]   cc:   Georgann Housekeeper, MD

## 2010-11-15 NOTE — Assessment & Plan Note (Signed)
[facility]                             PULMONARY OFFICE NOTE   NAME:[name] [name], [name] [name]                      MRN:          [id number]  DATE:10/13/2006                            DOB:          Jul 02, 1927    PULMONARY OFFICE FOLLOW-UP:   PROBLEM LIST:  1. Asthma/chronic obstructive pulmonary disease.  2. Asbestos exposure.  3. Renal insufficiency.  4. Allergic rhinitis.  5. Chronic sinusitis.  6. Nasal polyps.   HISTORY:  He had a sustained bronchitis which has only finally cleared.  Over the last 2 weeks he has not been sleeping very well and says if  that were fixed, everything else would be much better.  He still feels  very weak.  He had been up to a maximum of 60 mg of prednisone daily  and has just now gotten back to maintenance 10 mg daily as of today.  No  wheeze or chest pain.  Blood pressure gets low occasionally.  This is  not new.  He had had a single dose of Solu-Cortef from Dr. Talmage Nap but has  not been on other steroid management except his prednisone.   MEDICATIONS:  1. Prednisone now 10 mg daily.  2. Atrovent rescue inhaler.  3. Home nebulizer with albuterol and ipratropium.  4. He also uses budesonide 0.5 mg b.i.d.  5. Hytrin 5 mg.  6. Zocor 20 mg.  7. Xanax 0.5 mg occasionally.  8. Asmanex one puff b.i.d.  9. He has used Maxzide 25 mg.  10.Rescue albuterol inhaler.  11.Home oxygen is available at 2 L.  12.Home EpiPen.  13.Xolair 300 mg every 2 weeks is going without problems.   OBJECTIVE:  VITAL SIGNS:  Weight recorded today is 202 pounds compared  with 222 pounds March 15.  He thinks the truth is in between, around 212  pounds without significant change.  BP 98/62, which was commented upon.  Pulse 83.  Room air saturation 93%.  CHEST:  Breath sounds are diminished but clear and unlabored.  CARDIAC:  Heart sounds regular without murmur.  There was no stridor, no  edema.   IMPRESSION:  1. Asthma with chronic obstructive  pulmonary disease exacerbation.  2. Question adrenal insufficiency.  Should be stabilized on 10 mg of      maintenance prednisone.  3. Complained of insomnia recently after his resolved bronchitis.   PLAN:  1. He had a chest x-ray recently at Dr. Venita Sheffield office.  We will try      to get a copy of that report for our files.  2. Continue maintenance prednisone at 10 mg daily.  3. Steroid talk done.  4. Sample Lunesta 2 mg for transient insomnia.  5. Schedule return 6 weeks, earlier p.r.n.     Clinton D. Maple Hudson, MD, [facility], FACP  Electronically Signed    CDY/MedQ  DD: 10/13/2006  DT: 10/14/2006  Job #: [id number]   cc:   Georgann Housekeeper, MD

## 2010-11-15 NOTE — Assessment & Plan Note (Signed)
[facility]                             PULMONARY OFFICE NOTE   NAME:Borgmeyer, DOLPH TAVANO                      MRN:          [id number]  DATE:08/14/2006                            DOB:          [birth year]    PROBLEMS:  1. Asthma/chronic obstructive pulmonary disease.  2. Asbestos exposure.  3. Adrenal insufficiency.  4. Allergic rhinitis.  5. Chronic sinusitis.  6. Nasal polyps.   HISTORY:  Mr. Dunshee wants to resume Xolair. We have discussed side  effects, anaphylaxis, and broad experience with this medication now. We  also discussed criteria for success. His peak flow is averaging about  250 and he thinks it ran higher while he was on Xolair before. He is  using his nebulizer with albuterol and Ipratropium every day b.i.d. and  Asmanex 1 b.i.d. albuterol p.r.n. usually 2 or 3 times per day. Little  to no sputum, no chest pain or palpitation. He is not aware of reflux.   MEDICATIONS:  1. Maintenance prednisone 10 mg daily.  2. Nebulizer with albuterol and budesonide used b.i.d.  3. Atrovent metered inhaler.  4. Hytrin 5 mg.  5. Zocor 20 mg.  6. Xanax 0.5 mg daily p.r.n.  7. Rescue albuterol inhaler.  8. Oxygen at 2 liters p.r.n..  9. EpiPen.  10.He gets Solu-Cortef (Actorile).  11.He has been approved for Xolair to resume at 300 mg every 2 weeks.  12.He has not required recent hospital evaluation, but he still needs      a steroid burst occasionally. We are waiting to see how he does      with the season change.   OBJECTIVE:  Weight 222 pounds, blood pressure 148/82, pulse regular 74,  room air saturation 92%. His chest is fairly clear now after use home  nebulizer before he came over. Heart sounds are regular without murmur.  Maybe trace edema in the feet. No cyanosis. No adenopathy.   IMPRESSION:  Asthma with chronic obstructive pulmonary disease, history  of nasal polyps not seen this visit, chronic adrenal insufficiency.   PLAN:  We  are resuming Xolair 300 mg every 2 weeks. He will stay on  chronic prednisone for his adrenal insufficiency and schedule return in  6 weeks approximately.     Clinton D. Maple Hudson, MD, [facility], FACP  Electronically Signed    CDY/MedQ  DD: 08/14/2006  DT: 08/15/2006  Job #: [id number]   cc:   Georgann Housekeeper, MD  Francisca December, M.D.  Dorisann Frames, M.D.

## 2010-11-15 NOTE — Assessment & Plan Note (Signed)
[facility]                               PULMONARY OFFICE NOTE   NAME:[name] [name], [name] [name]                      MRN:          [id number]  DATE:02/02/2006                            DOB:          March 15, 1928    PULMONARY FOLLOWUP:   PROBLEM:  1.  Asthma/chronic obstructive pulmonary disease.  2.  Asbestos exposure.  3.  History of adrenal insufficiency.  4.  Allergic rhinitis.  5.  Chronic sinusitis.  6.  Nasal polyps.   HISTORY:  He has continued Xolair injections at 300 mg every 2 weeks.  Today, we discussed the recent delayed anaphylaxis reports, and he comments  that on at least 1 or 2 occasions, he thought he had had some headache a  week after his Xolair injection, and maybe sometimes some leg aching.  It is  very hard to tell if this is either a consistent pattern for him or related  to the Xolair.  I discussed anaphylaxis with Xolair and the option to  discontinue.  He credits Xolair with a marked stabilization of his  breathing, and especially correcting what had in the past been a really  bothersome nasal congestion, which has essentially gone away with this drug.  Again, the connection is unclear.  His choice is to continue the Xolair for  now.  He does have an EpiPen and understands the issues and how to use the  EpiPen.  He has his own treadmill machine, and is turning his oxygen up to 5  liters while he exercises.  He asks permission to go a little higher on the  oxygen with exercise, and his equipment will go up to 6 liters, so I have  said he could do that specifically only while exercising.  We discussed CO2  retention.  Dr. Talmage Nap is managing his maintenance prednisone for his adrenal  insufficiency, trying to work down from 10 mg daily using 5 mg tablets, and  he asks for a refill today.  He does not notice wheeze, rattle or  significant phlegm, but feels he must use his home nebulizer with albuterol  and ipratropium at least once  a day, sometimes twice.  He rarely needs his  Advair.   MEDICATIONS:  1.  Prednisone, currently 10 mg daily.  2.  Atrovent inhaler.  3.  Nebulizer with albuterol.  4.  Budesonide.  5.  Ipratropium.  6.  Hytrin 5 mg.  7.  Zocor 20 mg.  8.  Xanax 0.5 mg.  9.  Advair 500/50 used intermittently.  10. Xolair 300 mg injection every 2 weeks.  11. He has an injectable steroid from Dr. Talmage Nap, and I suggested that in      situations that suggest marked hypotension or anaphylaxis, that he use      both, but he is to ask Dr. Talmage Nap for clarification on that product.  12. He also has a rescue albuterol inhaler.  13. Oxygen maintenance is at 2 liters per minute.  14. EpiPen.   OBJECTIVE:  Weight 220 pounds.  Blood pressure 112/80, pulse rate 76, room  air  saturation 93%.  Breath sounds were markedly diminished.  Expiratory  phase is slow and chest is hyperinflated, but he looks comfortable and is  conversational today.  Pulse is regular without murmur heard.  There is no  peripheral edema.  No significant nasal congestion.  No adenopathy.   IMPRESSION:  1.  Stable event, chronic obstructive pulmonary disease.  2.  Previously identified nasal polyps may have resolved to the Xolair.   PLAN:  1.  Careful watch for a possible progressive reaction after Xolair was      discussed.  2.  Okay to exercise on oxygen up to 6 liters, returning to 2 liters at      rest.  3.  Refill prednisone 5 mg, #200 tabs, 1 or 2 daily as directed by Dr.      Talmage Nap, with bursts if needed for respiratory exacerbation.  4.  Schedule return in 4 months, earlier p.r.n.                                   Clinton D. Maple Hudson, MD, [facility], FACP   CDY/MedQ  DD:  02/02/2006  DT:  02/03/2006  Job #:  [id number]   cc:   Georgann Housekeeper, MD  Francisca December, MD  Dorisann Frames, MD

## 2010-11-15 NOTE — Op Note (Signed)
[facility]  Patient:    DORRIEN, GRUNDER                      MRN: [id number] Proc. Date: 12/04/00 Adm. Date:  [id number] Attending:  Katha Cabal CC:         Tyson Dense, M.D.  Charolett Bumpers III, M.D.   Operative Report  CCS NUMBER:  [id number]  PREOPERATIVE DIAGNOSIS:  Right inguinal hernia.  POSTOPERATIVE DIAGNOSIS:  Right inguinal hernia (indirect).  OPERATION:  SURGEON:  Thornton Park. Daphine Deutscher, M.D.  ANESTHESIA:  Local MAC.  INDICATIONS:  Mr. Trefz is a 77 year old gentleman with COPD and a right inguinal hernia which has been uncomfortable.  DESCRIPTION OF PROCEDURE:   He was taking to room #8 at [facility] and the area was prepped with Betadine and draped sterilely.  A regional block was performed using a mixture of Marcaine and lidocaine.  A small oblique incision was made and carried down to the fatty tissue.  The superficial veins were ligated with 4-0 Vicryl.  The external oblique was incised, and the ilioinguinal nerve branch was retracted superiorly.  I mobilized the cord and went proximally and found a large indirect inguinal hernia which I dissected free from the cord structures.  I opened it and put my finger into the abdomen and then twisted it off and ligated it with two figure-of-eight sutures using the 2-0 silk.  I excised the excess sac.  I then allowed this to retract up into the abdomen.  I cut a piece of mesh to fit the inguinal floor and sutured along the inguinal ligament with a running 2-0 Prolene.  I sutured it to the internal oblique with a running 2-0 Prolene and sutured it to itself around the cord structures with a horizontal mattress suture of 2-0 Prolene.  This was then tucked beneath the external oblique and then the ilioinguinal nerve branch was allowed to lie down on top of it and it was not encumbered by the mesh.  The external oblique was closed with a running 2-0 Vicryl.  Then  4-0 Vicryl was used in the subcutaneous tissue and subcuticularly where I had used some skin markers to realign the oblique incision.  The wound was closed with 5-0 Vicryl subcuticularly with Benzoin and Steri-Strips.  The patient tolerated the procedure well.  DISPOSITION:  He will be given Maxidone to take for pain.  He will be followed up in the office in approximately three weeks. DD:  12/04/00 TD:  12/05/00 Job: [id number]

## 2010-11-15 NOTE — Assessment & Plan Note (Signed)
[facility]                               PULMONARY OFFICE NOTE   NAME:[name] [name], [name] [name]                      MRN:          [id number]  DATE:05/12/2006                            DOB:          Nov 13, 1927    PROBLEM LIST:  1. Asthma/chronic obstructive pulmonary disease.  2. Asbestos exposure.  3. History of adrenal insufficiency.  4. Allergic rhinitis.  5. Chronic sinusitis.  6. Nasal polyps.   HISTORY:  He has remained on low-dose maintenance prednisone replacement  from Dr. Talmage Nap because of his adrenal insufficiency taking 5-10 mg daily. In  the last 4 months, he complains his legs have felt heavy/stiff while  walking. He does not have claudication pain. He thinks the stiffness is in  the joints. Breathing has been much more stable and continues to do well for  him. He gives credit to the Xolair injections with which he has also noticed  rather dramatic improvement in chronic nasal congestion. He is satisfied to  continue Xolair. We also discussed steroid inhalers and he is willing to  restart Asmanex. In the past week, he has had a little increase in chest  congestion without sore throat, fever, edema, or other acute change.   MEDICATIONS:  1. Prednisone 10 mg daily.  2. Atrovent and albuterol by nebulizer.  3. Hytrin 5 mg.  4. Zocor 20 mg.  5. Xanax 0.5 mg b.i.d. p.r.n.  6. Xolair 300 mg every 2 weeks.  7. Albuterol rescue inhaler.  8. Home oxygen at 2 liters used rarely p.r.n.  9. Epipen.   OBJECTIVE:  VITAL SIGNS:  Weight 220 pounds, BP 108/78, pulse regular 81,  room air saturation 92%.  HEENT:  There is a nasal polyp visible superiorly in the left naris,  nonobstructing, bilateral mild nasal turbinate edema.  LUNGS:  There are mild rhonchi in the mid zones, right greater than left  without dullness or increased work of breathing.  HEART:  Sounds are regular without murmur.  EXTREMITIES:  There is no peripheral edema or cyanosis  and I do not think he  is clubbed any.   IMPRESSION:  1. Recent exacerbation of asthma with chronic obstructive pulmonary      disease.  2. Complaint of heavy/stiff legs. This could be some joint or muscle      stiffness. Watch for claudication.  3. Nasal polyps.  4. Maintenance adrenal replacement.   PLAN:  1. Restart Asmanex 1 puff b.i.d. while continuing prednisone guided by Dr.      Talmage Nap.  2. Continue Xolair.  3. Schedule return in 4 months, earlier p.r.n.     Clinton D. Maple Hudson, MD, [facility], FACP  Electronically Signed    CDY/MedQ  DD: 05/12/2006  DT: 05/13/2006  Job #: [id number]   cc:   Georgann Housekeeper, MD  Francisca December, M.D.  Dorisann Frames, M.D.

## 2011-02-14 ENCOUNTER — Other Ambulatory Visit: Payer: Self-pay | Admitting: Neurology

## 2011-02-14 DIAGNOSIS — R269 Unspecified abnormalities of gait and mobility: Secondary | ICD-10-CM

## 2011-02-17 ENCOUNTER — Other Ambulatory Visit: Payer: Self-pay | Admitting: *Deleted

## 2011-02-17 MED ORDER — ALBUTEROL SULFATE (2.5 MG/3ML) 0.083% IN NEBU: INHALATION_SOLUTION | RESPIRATORY_TRACT | Status: DC

## 2011-02-22 ENCOUNTER — Other Ambulatory Visit: Payer: Medicare Other

## 2011-02-25 ENCOUNTER — Other Ambulatory Visit: Payer: Medicare Other

## 2011-02-26 ENCOUNTER — Other Ambulatory Visit: Payer: Self-pay | Admitting: Internal Medicine

## 2011-03-10 ENCOUNTER — Ambulatory Visit
Admission: RE | Admit: 2011-03-10 | Discharge: 2011-03-10 | Disposition: A | Discharge status: Home / Self Care | Payer: Medicare Other | Source: Ambulatory Visit | Attending: Neurology | Admitting: Neurology

## 2011-03-10 DIAGNOSIS — R269 Unspecified abnormalities of gait and mobility: Secondary | ICD-10-CM

## 2011-03-10 MED ORDER — GADOBENATE DIMEGLUMINE 529 MG/ML IV SOLN
17.0000 mL | Freq: Once | INTRAVENOUS | Status: AC | PRN
  Administered 2011-03-10: 17 mL via INTRAVENOUS

## 2011-05-02 ENCOUNTER — Telehealth: Payer: Self-pay | Admitting: Internal Medicine

## 2011-05-02 MED ORDER — PREDNISONE 5 MG PO TABS: ORAL_TABLET | ORAL | Status: AC

## 2011-05-02 NOTE — Telephone Encounter (Signed)
Called and spoke with pt.  Pt states he is on Prednisone daily---takes 2 5mg  tabs.  Pt states he thinks he is 'coming down with something'.  Wonders if it's "bronchitis."  States symptoms started today.  C/o sore throat, chest congestion, coughing up clear sputum, increased sob and wheezing.  Pt states in the past [facility] would send in large quantities of prednisone for like #100 tabs per month to allow for pt to increase his prednisone when he has these "flare ups."  Pt is wanting [facility] to send new rx for #100  (but would like a 3 month supply---so # 300 x 3 refills)  [facility], please advise if ok or not.  Thanks No Known Allergies

## 2011-05-02 NOTE — Telephone Encounter (Signed)
Per CDY okay to do so. I have spoke with pt and is aware his new rx for prednisone has been sent to pharmacy w/ #300 x 3 refills. Pt verbalized understanding and needed nothing further

## 2011-06-20 ENCOUNTER — Other Ambulatory Visit: Payer: Self-pay | Admitting: Allergy

## 2011-06-20 ENCOUNTER — Telehealth: Payer: Self-pay | Admitting: Internal Medicine

## 2011-06-20 MED ORDER — ALBUTEROL SULFATE (2.5 MG/3ML) 0.083% IN NEBU: INHALATION_SOLUTION | RESPIRATORY_TRACT | Status: DC

## 2011-06-20 NOTE — Telephone Encounter (Signed)
[facility] requesting rx for albuterol neb sol. 120 ml with 12 rfills  rx called in to [facility]  No Known Allergies

## 2011-07-04 ENCOUNTER — Ambulatory Visit (INDEPENDENT_AMBULATORY_CARE_PROVIDER_SITE_OTHER): Payer: Medicare Other | Admitting: Internal Medicine

## 2011-07-04 ENCOUNTER — Encounter: Payer: Self-pay | Admitting: Internal Medicine

## 2011-07-04 VITALS — BP 110/68 | HR 48 | Ht 71.0 in | Wt 185.2 lb

## 2011-07-04 DIAGNOSIS — Z87898 Personal history of other specified conditions: Secondary | ICD-10-CM

## 2011-07-04 DIAGNOSIS — J330 Polyp of nasal cavity: Secondary | ICD-10-CM

## 2011-07-04 DIAGNOSIS — I491 Atrial premature depolarization: Secondary | ICD-10-CM

## 2011-07-04 DIAGNOSIS — R0602 Shortness of breath: Secondary | ICD-10-CM

## 2011-07-04 MED ORDER — FEXOFENADINE HCL 60 MG PO TABS: ORAL_TABLET | ORAL | Status: DC

## 2011-07-04 MED ORDER — MOMETASONE FUROATE 50 MCG/ACT NA SUSP: 2.0000 | Freq: Every day | NASAL | Status: DC

## 2011-07-04 NOTE — Progress Notes (Signed)
Subjective:    Patient ID: [name] [name], male    DOB: 1927/08/21, 78 y.o.   MRN: [id number]  HPI 78 yo former smoker followed for chronic obstructive asthma, Allergic rhintiis,  COPD, suspected asbestos exposure after working in [employer]. Had seen Dr Everardo All for endocrinology evaluation concerned about adrenal insufficiency after a lot of prednisone therapy in past years. Now remains on prednisone 10 mg daily. Last here 02/04/2010-no major acute problems since last here. He indicates frustration that he can't exert to the level of digging a hole or using a pick. He can walk on treadmill, go to mail box. Denies cough, wheeze, chest pain. Maybe a little palpitation occasionally. Xolair failed to help. Doesn't notice weather or pollen. He notes that he no longer has the asthma flares that he used to- not in a couple of years. Long discussion of steroid therapy, adrenal insufficiency and etc. He goes to gym and leg presses 400 lbs, but complains that legs feel weak.  Peak Flow best was 450 a few years ago, but is 350 now. PFT 02/04/10- FEV1 1.93/ 70%; FEV1/FVC 0.41; insignificant response to bronchodilator, DLCO 95%.  07/04/11- 20 yo former smoker followed for chronic obstructive asthma, Allergic rhintiis,  COPD, suspected asbestos exposure after working in [employer]. Had seen Dr Everardo All for endocrinology evaluation concerned about adrenal insufficiency after a lot of prednisone therapy in past years. Maintenance prednisone 10 mg. Failed Xolair.  Had flu vaccine and has gotten to the winter so far with no significant colds. Evaluated by neurology for weakness which has been a long-term complaints, especially in his legs. He continues to exercise. He can exercise his legs and does not understand why he gets some easily tired climbing stairs. He is not aware of slow heart rate but felt his heart rate race once after exercise. It resolved gradually and spontaneously.  ROS-see HPI Constitutional:   No-    weight loss, night sweats, fevers, chills, fatigue, lassitude. HEENT:   No-  headaches, difficulty swallowing, tooth/dental problems, sore throat,       No-  sneezing, itching, ear ache, nasal congestion, post nasal drip,  CV:  No-   chest pain, orthopnea, PND, swelling in lower extremities, anasarca,  dizziness, palpitations Resp: No- acute  shortness of breath with exertion or at rest.              No-   productive cough,  No non-productive cough,  No- coughing up of blood.              No-   change in color of mucus.  No- wheezing.   Skin: No-   rash or lesions. GI:  No-   heartburn, indigestion, abdominal pain, nausea, vomiting, diarrhea,                 change in bowel habits, loss of appetite GU: No-   dysuria, change in color of urine, no urgency or frequency.  No- flank pain. MS:  + joint pain or swelling.  No- decreased range of motion.  No- back pain. Neuro-     nothing unusual Psych:  No- change in mood or affect. No depression or anxiety.  No memory loss.  OBJ General- Alert, Oriented, Affect-appropriate, Distress- none acute, has lost some weight Skin- rash-none, lesions- none, excoriation- none Lymphadenopathy- none Head- atraumatic            Eyes- Gross vision intact, PERRLA, conjunctivae clear secretions  Ears- Hearing, canals-normal            Nose- +congested with bilateral polyps, no-Septal dev, mucus,  erosion, perforation             Throat- Mallampati II , mucosa clear , drainage- none, tonsils- atrophic Neck- flexible , trachea midline, no stridor , thyroid nl, carotid no bruit Chest - symmetrical excursion , unlabored           Heart/CV- , no murmur , no gallop  , no rub, nl s1 s2--------- Pulse registered slow on arrival, then bigeminal to palpation, then reverted to regular about 50/min                           - JVD- none , edema- none, stasis changes- none, varices- none           Lung- clear to P&A, wheeze- none, cough- none , dullness-none, rub-  none           Chest wall-  Abd- tender-no, distended-no, bowel sounds-present, HSM- no Br/ Gen/ Rectal- Not done, not indicated Extrem- cyanosis- none, clubbing, none, atrophy- none, strength- nl Neuro- grossly intact to observation

## 2011-07-04 NOTE — Patient Instructions (Signed)
Script for Nasonex- this can be used every day to shrink back the polyps.  Ok to use antihistamine fexofenadine/ Allegra  Be sure to ask Dr Donette Larry about your pulse rate  Please call as needed

## 2011-07-07 NOTE — Assessment & Plan Note (Signed)
The early bigeminal rhythm he presented with today may have been alternating sinus beats and PVCs. He may be headed towards atrial fibrillation. His primary physician may want to send him to cardiology for monitoring.

## 2011-07-07 NOTE — Assessment & Plan Note (Signed)
No history of aspirin sensitivity. We discussed medication versus surgical therapy and likelihood of recurrence. He will try using nasal steroid regularly for a while but may need ENT surgery to clip the polyps.

## 2011-07-07 NOTE — Assessment & Plan Note (Signed)
He has complained of exertional dyspnea and weakness for years. We felt he had some adrenal insufficiency. There may be a steroid thigh weakness component from his  chronic maintenance steroids He is encouraged to maintain general endurance as much as he can. Also consider the possibility of arterial insufficiency.

## 2011-07-07 NOTE — Assessment & Plan Note (Signed)
Remains on maintenance prednisone.

## 2012-02-12 ENCOUNTER — Ambulatory Visit (INDEPENDENT_AMBULATORY_CARE_PROVIDER_SITE_OTHER): Payer: Medicare Other | Admitting: Internal Medicine

## 2012-02-12 ENCOUNTER — Encounter: Payer: Self-pay | Admitting: Internal Medicine

## 2012-02-12 VITALS — BP 108/70 | HR 86 | Ht 71.0 in | Wt 188.6 lb

## 2012-02-12 DIAGNOSIS — J330 Polyp of nasal cavity: Secondary | ICD-10-CM

## 2012-02-12 DIAGNOSIS — J449 Chronic obstructive pulmonary disease, unspecified: Secondary | ICD-10-CM

## 2012-02-12 MED ORDER — METHYLPREDNISOLONE ACETATE 80 MG/ML IJ SUSP
80.0000 mg | Freq: Once | INTRAMUSCULAR | Status: AC
  Administered 2012-02-12: 80 mg via INTRAMUSCULAR

## 2012-02-12 MED ORDER — PHENYLEPHRINE HCL 1 % NA SOLN
3.0000 [drp] | Freq: Once | NASAL | Status: AC
  Administered 2012-02-12: 3 [drp] via NASAL

## 2012-02-12 MED ORDER — AZITHROMYCIN 250 MG PO TABS: ORAL_TABLET | ORAL | Status: DC

## 2012-02-12 NOTE — Patient Instructions (Addendum)
Neb neo nasal  Depo 80  Script sent for Z pak antibiotic to take on trips in case of infection

## 2012-02-12 NOTE — Progress Notes (Signed)
Subjective:    Patient ID: [name] [name], male    DOB: [birth year], 78 y.o.   MRN: [id number]  HPI 78 yo former smoker followed for chronic obstructive asthma, Allergic rhintiis,  COPD, suspected asbestos exposure after working in [employer]. Had seen Dr Everardo All for endocrinology evaluation concerned about adrenal insufficiency after a lot of prednisone therapy in past years. Now remains on prednisone 10 mg daily. Last here 02/04/2010-no major acute problems since last here. He indicates frustration that he can't exert to the level of digging a hole or using a pick. He can walk on treadmill, go to mail box. Denies cough, wheeze, chest pain. Maybe a little palpitation occasionally. Xolair failed to help. Doesn't notice weather or pollen. He notes that he no longer has the asthma flares that he used to- not in a couple of years. Long discussion of steroid therapy, adrenal insufficiency and etc. He goes to gym and leg presses 400 lbs, but complains that legs feel weak.  Peak Flow best was 450 a few years ago, but is 350 now. PFT 02/04/10- FEV1 1.93/ 70%; FEV1/FVC 0.41; insignificant response to bronchodilator, DLCO 95%.  07/04/11- 78 yo former smoker followed for chronic obstructive asthma, Allergic rhintiis,  COPD, suspected asbestos exposure after working in [employer]. Had seen Dr Everardo All for endocrinology evaluation concerned about adrenal insufficiency after a lot of prednisone therapy in past years. Maintenance prednisone 10 mg. Failed Xolair.  Had flu vaccine and has gotten to the winter so far with no significant colds. Evaluated by neurology for weakness which has been a long-term complaint, especially in his legs. He continues to exercise. He can exercise his legs and does not understand why he gets some easily tired climbing stairs. He is not aware of slow heart rate but felt his heart rate race once after exercise. It resolved gradually and spontaneously.  02/12/12- 72 yo former smoker  followed for chronic obstructive asthma, Allergic rhintiis, nasal polyps,  suspected asbestos exposure after working in [employer]. Had seen Dr Everardo All for endocrinology evaluation concerned about adrenal insufficiency after a lot of prednisone therapy in past years. Maintenance prednisone 10 mg. Failed Xolair. Says his breathing is okay, meaning stable. Mainly he feels limited by leg weakness diagnosed as neuropathy. He no longer notices wheezing or dyspnea at rest. Complains of frontal headaches and nasal stuffiness  ROS-see HPI Constitutional:   No-   weight loss, night sweats, fevers, chills, fatigue, lassitude. HEENT:   + headaches, difficulty swallowing, tooth/dental problems, sore throat,       No-  sneezing, itching, ear ache, +asal congestion, post nasal drip,  CV:  No-   chest pain, orthopnea, PND, swelling in lower extremities, anasarca,  dizziness, palpitations Resp: No- acute  shortness of breath with exertion or at rest.              No-   productive cough,  No non-productive cough,  No- coughing up of blood.              No-   change in color of mucus.  No- wheezing.   Skin: No-   rash or lesions. GI:  No-   heartburn, indigestion, abdominal pain, nausea, vomiting, GU:  MS:  + joint pain or swelling.  Neuro-     nothing unusual Psych:  No- change in mood or affect. No depression or anxiety.  No memory loss.  OBJ General- Alert, Oriented, Affect-appropriate, Distress- none acute, has lost some weight Skin- rash-none, lesions-  none, excoriation- none Lymphadenopathy- none Head- atraumatic            Eyes- Gross vision intact, PERRLA, conjunctivae clear secretions            Ears- Hearing, canals-normal            Nose- +congested with +bilateral polyps, no-Septal dev, mucus,  erosion, perforation             Throat- Mallampati II , mucosa clear , drainage- none, tonsils- atrophic Neck- flexible , trachea midline, no stridor , thyroid nl, carotid no bruit Chest -  symmetrical excursion , unlabored           Heart/CV- , no murmur , no gallop  , no rub, nl s1 s2--------- Pulse registered slow on arrival, then bigeminal to palpation, then reverted to regular about 50/min                           - JVD- none , edema- none, stasis changes- none, varices- none           Lung- clear to P&A, wheeze- none, cough- none , dullness-none, rub- none           Chest wall-  Abd-  Br/ Gen/ Rectal- Not done, not indicated Extrem- cyanosis- none, clubbing, none, atrophy- none, strength- nl Neuro- grossly intact to observation

## 2012-02-18 NOTE — Assessment & Plan Note (Signed)
If he gets too uncomfortable we will offer surgical referral. For now I've emphasized use of nasal steroid spray every day. Plan nasal nebulizer, Depo-Medrol

## 2012-02-18 NOTE — Assessment & Plan Note (Signed)
No longer much reversible component and he doesn't find exertional shortness of breath very limiting.

## 2012-03-02 ENCOUNTER — Telehealth: Payer: Self-pay | Admitting: Internal Medicine

## 2012-03-02 MED ORDER — PREDNISONE 10 MG PO TABS: ORAL_TABLET | ORAL | Status: DC

## 2012-03-02 NOTE — Telephone Encounter (Signed)
Accidentally closed encounter; per CY-give prednisone #50 take 20 mg qd x 7 days then 10 mg qd no refills; pt aware to call us back in next 2-3 days to let us know how he is feeling. Might need OV with CY to assess.

## 2012-03-02 NOTE — Addendum Note (Signed)
Addended by: Reynaldo Minium C on: 03/02/2012 04:19 PM   Modules accepted: Orders

## 2012-03-02 NOTE — Telephone Encounter (Signed)
Last OV 8/15-13, next OV 08-16-12. I spoke with the pt and he states that x 1.5 weeks he has been having increased chest congestion, productive cough with clear phlegm, but the amount of phlegm has increased, wheezing, and a rattling sound in his chest. Pt denies any chest tightness or increase in SOB. Pt is on 10mg  prednisone daily, but states he increased his prednsione to 50 mg without relief. Pt states he needs a new rx for prednisone and wants to know what other recs CY may have. Please advise.Carron Curie, CMA No Known Allergies

## 2012-07-21 ENCOUNTER — Telehealth: Payer: Self-pay | Admitting: Internal Medicine

## 2012-07-21 MED ORDER — PREDNISONE 5 MG PO TABS: ORAL_TABLET | ORAL | Status: DC

## 2012-07-21 NOTE — Telephone Encounter (Signed)
Pharmacy requesting rx  Prednisone 5mg >< take 2 tablets daily or as directed . Last fill 04/26/12 orig date 05/02/2011. Has appt 2-17/2014 No Known Allergies Dr young please advise.  Thank you

## 2012-07-21 NOTE — Telephone Encounter (Signed)
Per CY: ok to refill x3 months.  Rx sent.

## 2012-08-16 ENCOUNTER — Ambulatory Visit: Payer: Medicare Other | Admitting: Internal Medicine

## 2012-08-24 ENCOUNTER — Encounter: Payer: Self-pay | Admitting: Internal Medicine

## 2012-08-24 ENCOUNTER — Ambulatory Visit (INDEPENDENT_AMBULATORY_CARE_PROVIDER_SITE_OTHER): Payer: Medicare Other | Admitting: Internal Medicine

## 2012-08-24 ENCOUNTER — Ambulatory Visit (INDEPENDENT_AMBULATORY_CARE_PROVIDER_SITE_OTHER)
Admission: RE | Admit: 2012-08-24 | Discharge: 2012-08-24 | Disposition: A | Discharge status: Home / Self Care | Payer: Medicare Other | Source: Ambulatory Visit | Attending: Internal Medicine | Admitting: Internal Medicine

## 2012-08-24 VITALS — BP 122/70 | HR 61 | Ht 71.0 in | Wt 193.0 lb

## 2012-08-24 DIAGNOSIS — J449 Chronic obstructive pulmonary disease, unspecified: Secondary | ICD-10-CM

## 2012-08-24 MED ORDER — PREDNISONE 5 MG PO TABS: ORAL_TABLET | ORAL | Status: DC

## 2012-08-24 NOTE — Progress Notes (Signed)
Subjective:    Patient ID: [name] [name], male    DOB: [birth year], 79 y.o.   MRN: [id number]  HPI 79 yo former smoker followed for chronic obstructive asthma, Allergic rhintiis,  COPD, suspected asbestos exposure after working in [employer]. Had seen Dr Everardo All for endocrinology evaluation concerned about adrenal insufficiency after a lot of prednisone therapy in past years. Now remains on prednisone 10 mg daily. Last here 02/04/2010-no major acute problems since last here. He indicates frustration that he can't exert to the level of digging a hole or using a pick. He can walk on treadmill, go to mail box. Denies cough, wheeze, chest pain. Maybe a little palpitation occasionally. Xolair failed to help. Doesn't notice weather or pollen. He notes that he no longer has the asthma flares that he used to- not in a couple of years. Long discussion of steroid therapy, adrenal insufficiency and etc. He goes to gym and leg presses 400 lbs, but complains that legs feel weak.  Peak Flow best was 450 a few years ago, but is 350 now. PFT 02/04/10- FEV1 1.93/ 70%; FEV1/FVC 0.41; insignificant response to bronchodilator, DLCO 95%.  07/04/11- 81 yo former smoker followed for chronic obstructive asthma, Allergic rhintiis,  COPD, suspected asbestos exposure after working in [employer]. Had seen Dr Everardo All for endocrinology evaluation concerned about adrenal insufficiency after a lot of prednisone therapy in past years. Maintenance prednisone 10 mg. Failed Xolair.  Had flu vaccine and has gotten to the winter so far with no significant colds. Evaluated by neurology for weakness which has been a long-term complaint, especially in his legs. He continues to exercise. He can exercise his legs and does not understand why he gets some easily tired climbing stairs. He is not aware of slow heart rate but felt his heart rate race once after exercise. It resolved gradually and spontaneously.  02/12/12- 62 yo former smoker  followed for chronic obstructive asthma, Allergic rhintiis, nasal polyps,  suspected asbestos exposure after working in [employer]. Had seen Dr Everardo All for endocrinology evaluation concerned about adrenal insufficiency after a lot of prednisone therapy in past years. Maintenance prednisone 10 mg. Failed Xolair. Says his breathing is okay, meaning stable. Mainly he feels limited by leg weakness diagnosed as neuropathy. He no longer notices wheezing or dyspnea at rest. Complains of frontal headaches and nasal stuffiness  08/24/12- 42 yo former smoker followed for chronic obstructive asthma/steroid dependent, Allergic rhintiis, nasal polyps,  suspected asbestos exposure after working in [employer]. follows for: pt states having increased sob upon activity,denies any wheezing,cough.  He has continued prednisone 10 mg daily maintenance. He asks prescription adjustment to allow a few extras. He continues to exercise at the [employer] but complains that his neuropathy limits his walking by weakening his legs. Peak flow max is now around 300. History of supraventricular tachycardia with recent echocardiogram. He is aware of chronic palpitations.  ROS-see HPI Constitutional:   No-   weight loss, night sweats, fevers, chills, fatigue, lassitude. HEENT:   + headaches, difficulty swallowing, tooth/dental problems, sore throat,       No-  sneezing, itching, ear ache, +nasal congestion, post nasal drip,  CV:  No-   chest pain, orthopnea, PND, swelling in lower extremities, anasarca,  dizziness, +palpitations Resp: No- acute  shortness of breath with exertion or at rest.              No-   productive cough,  No non-productive cough,  No- coughing up of blood.  No-   change in color of mucus.  No- wheezing.   Skin: No-   rash or lesions. GI:  No-   heartburn, indigestion, abdominal pain, nausea, vomiting, GU:  MS:  + joint pain or swelling.  Neuro-     +chronic paresthesias and weakness in lower  extremities Psych:  No- change in mood or affect. No depression or anxiety.  No memory loss.  OBJ General- Alert, Oriented, Affect-appropriate, Distress- none acute,  Skin- rash-none, lesions- none, excoriation- none Lymphadenopathy- none Head- atraumatic            Eyes- Gross vision intact, PERRLA, conjunctivae clear secretions            Ears- Hearing, canals-normal            Nose- +congested with +bilateral polyps, no-Septal dev, mucus,  erosion, perforation             Throat- Mallampati II , mucosa clear , drainage- none, tonsils- atrophic Neck- flexible , trachea midline, no stridor , thyroid nl, carotid no bruit Chest - symmetrical excursion , unlabored           Heart/CV- ,+ regular with frequent extra beats, no murmur , no gallop  ,                            - JVD- none , edema- none, stasis changes- none, varices- none           Lung- +diminished but clear to P&A, wheeze- none, cough- none , dullness-none, rub- none           Chest wall-  Abd-  Br/ Gen/ Rectal- Not done, not indicated Extrem- cyanosis- none, clubbing, none, atrophy- none, strength- nl Neuro- +tremor hands

## 2012-08-24 NOTE — Patient Instructions (Addendum)
Script for prednisone re-written to allow up to 3 daily when needed  Order- CXR    Dx asthma with COPD

## 2012-08-25 NOTE — Assessment & Plan Note (Signed)
We assume adrenal insufficiency noting evaluation several years ago. He also benefits from maintenance prednisone to stabilize his airways. Plan-prescribe enough prednisone so that he can take an extra tablet occasionally if needed. Chest x-ray.

## 2012-08-25 NOTE — Assessment & Plan Note (Signed)
Continue nasal steroids

## 2012-08-30 NOTE — Progress Notes (Signed)
Quick Note:  ATC patient at both numbers-[facility] number for home number can not take our call and mobile number does not have voicemail set up at this time. Will need to try again Tuesday 08-31-12 ______

## 2012-10-04 ENCOUNTER — Telehealth: Payer: Self-pay | Admitting: Internal Medicine

## 2012-10-04 NOTE — Telephone Encounter (Signed)
ATC patient, phone rang several times with no answer. WCB 

## 2012-10-05 MED ORDER — ALBUTEROL SULFATE (2.5 MG/3ML) 0.083% IN NEBU: INHALATION_SOLUTION | RESPIRATORY_TRACT | Status: DC

## 2012-10-05 NOTE — Telephone Encounter (Signed)
Called spoke with patient who stated that [facility] told him "that they faxed a refill request to you people twice and haven't heard anything back." Apologized to patient for the inconvenience and advised that his medication will be filled now Sharyn Creamer spoke with Kathlene November pharmacist Pt's insurance will allow for #[id number] with up to 11 additional refills VO given to [place] Nothing further needed Will sign off

## 2013-02-22 ENCOUNTER — Encounter: Payer: Self-pay | Admitting: Internal Medicine

## 2013-02-22 ENCOUNTER — Ambulatory Visit (INDEPENDENT_AMBULATORY_CARE_PROVIDER_SITE_OTHER): Payer: Medicare Other | Admitting: Internal Medicine

## 2013-02-22 VITALS — BP 104/60 | HR 48 | Ht 71.0 in | Wt 191.0 lb

## 2013-02-22 DIAGNOSIS — Z5189 Encounter for other specified aftercare: Secondary | ICD-10-CM

## 2013-02-22 DIAGNOSIS — J330 Polyp of nasal cavity: Secondary | ICD-10-CM

## 2013-02-22 DIAGNOSIS — J449 Chronic obstructive pulmonary disease, unspecified: Secondary | ICD-10-CM

## 2013-02-22 DIAGNOSIS — T6391XD Toxic effect of contact with unspecified venomous animal, accidental (unintentional), subsequent encounter: Secondary | ICD-10-CM

## 2013-02-22 MED ORDER — ALBUTEROL SULFATE HFA 108 (90 BASE) MCG/ACT IN AERS: INHALATION_SPRAY | RESPIRATORY_TRACT | Status: DC

## 2013-02-22 MED ORDER — EPINEPHRINE 0.3 MG/0.3ML IJ SOAJ: 0.3000 mg | Freq: Once | INTRAMUSCULAR | Status: DC

## 2013-02-22 NOTE — Patient Instructions (Addendum)
Epipen refilled in case of insect sting as discussed  We can continue present meds  Please call as needed

## 2013-02-22 NOTE — Progress Notes (Signed)
Subjective:    Patient ID: [name] [name], male    DOB: 08/02/1927, 79 y.o.   MRN: [id number]  HPI 79 yo former smoker followed for chronic obstructive asthma, Allergic rhintiis,  COPD, suspected asbestos exposure after working in [employer]. Had seen Dr Loanne Drilling for endocrinology evaluation concerned about adrenal insufficiency after a lot of prednisone therapy in past years. Now remains on prednisone 10 mg daily. Last here 02/04/2010-no major acute problems since last here. He indicates frustration that he can't exert to the level of digging a hole or using a pick. He can walk on treadmill, go to mail box. Denies cough, wheeze, chest pain. Maybe a little palpitation occasionally. Xolair failed to help. Doesn't notice weather or pollen. He notes that he no longer has the asthma flares that he used to- not in a couple of years. Long discussion of steroid therapy, adrenal insufficiency and etc. He goes to gym and leg presses 400 lbs, but complains that legs feel weak.  Peak Flow best was 450 a few years ago, but is 350 now. PFT 02/04/10- FEV1 1.93/ 70%; FEV1/FVC 0.41; insignificant response to bronchodilator, DLCO 95%.  07/04/11- 68 yo former smoker followed for chronic obstructive asthma, Allergic rhintiis,  COPD, suspected asbestos exposure after working in [employer]. Had seen Dr Loanne Drilling for endocrinology evaluation concerned about adrenal insufficiency after a lot of prednisone therapy in past years. Maintenance prednisone 10 mg. Failed Xolair.  Had flu vaccine and has gotten to the winter so far with no significant colds. Evaluated by neurology for weakness which has been a long-term complaint, especially in his legs. He continues to exercise. He can exercise his legs and does not understand why he gets some easily tired climbing stairs. He is not aware of slow heart rate but felt his heart rate race once after exercise. It resolved gradually and spontaneously.  02/12/12- 65 yo former smoker  followed for chronic obstructive asthma, Allergic rhintiis, nasal polyps,  suspected asbestos exposure after working in [employer]. Had seen Dr Loanne Drilling for endocrinology evaluation concerned about adrenal insufficiency after a lot of prednisone therapy in past years. Maintenance prednisone 10 mg. Failed Xolair. Says his breathing is okay, meaning stable. Mainly he feels limited by leg weakness diagnosed as neuropathy. He no longer notices wheezing or dyspnea at rest. Complains of frontal headaches and nasal stuffiness  08/24/12- 29 yo former smoker followed for chronic obstructive asthma/steroid dependent, Allergic rhintiis, nasal polyps,  suspected asbestos exposure after working in [employer]. follows for: pt states having increased sob upon activity,denies any wheezing,cough.  He has continued prednisone 10 mg daily maintenance. He asks prescription adjustment to allow a few extras. He continues to exercise at the [employer] but complains that his neuropathy limits his walking by weakening his legs. Peak flow max is now around 300. History of supraventricular tachycardia with recent echocardiogram. He is aware of chronic palpitations.  02/22/13- 24 yo former smoker followed for chronic obstructive asthma/steroid dependent, Allergic rhintiis, nasal polyps,  suspected asbestos exposure after working in [employer], hx SVT/ palpitation. FOLLOWS FOR:sob staying same,occass. wheezing,denies cough,cp or tightness Peripheral neuropathy restrict his walking distance now. Chronic complaint of "no energy". He feels he is breathing comfortably through his nose. Keeps EpiPen because of history of insect venom allergy-bee sting- remote. CXR 08/24/12- CHEST - 2 VIEW  Comparison: 06/04/2009  Findings: Lungs are essentially clear. Scattered calcified pleural  plaques. No focal consolidation. No pleural effusion or  pneumothorax.  The heart is top normal  in size.  Mild degenerative changes of the visualized  thoracolumbar spine.  IMPRESSION:  No evidence of acute cardiopulmonary disease.  Original Report Authenticated By: Charline Bills, M.D.  ROS-see HPI Constitutional:   No-   weight loss, night sweats, fevers, chills, fatigue, lassitude. HEENT:   + headaches, difficulty swallowing, tooth/dental problems, sore throat,       No-  sneezing, itching, ear ache, +nasal congestion, post nasal drip,  CV:  No-   chest pain, orthopnea, PND, swelling in lower extremities, anasarca,  dizziness, +palpitations Resp: No- acute  shortness of breath with exertion or at rest.              No-   productive cough,  No non-productive cough,  No- coughing up of blood.              No-   change in color of mucus.  No- wheezing.   Skin: No-   rash or lesions. GI:  No-   heartburn, indigestion, abdominal pain, nausea, vomiting, GU:  MS:  + joint pain or swelling.  Neuro-     +chronic paresthesias and weakness in lower extremities Psych:  No- change in mood or affect. No depression or anxiety.  No memory loss.  OBJ General- Alert, Oriented, Affect-appropriate, Distress- none acute,  Skin- rash-none, lesions- none, excoriation- none Lymphadenopathy- none Head- atraumatic            Eyes- Gross vision intact, PERRLA, conjunctivae clear secretions            Ears- Hearing, canals-normal            Nose- +congested with +bilateral polyps, no-Septal dev, mucus,  erosion, perforation             Throat- Mallampati II , mucosa clear , drainage- none, tonsils- atrophic Neck- flexible , trachea midline, no stridor , thyroid nl, carotid no bruit Chest - symmetrical excursion , unlabored           Heart/CV- ,+ regular bigeminalis, no murmur , no gallop  ,                            - JVD- none , edema- none, stasis changes- none, varices- none           Lung- +diminished but clear to P&A, wheeze- none, cough- none , dullness-none, rub- none           Chest wall-  Abd-  Br/ Gen/ Rectal- Not done, not  indicated Extrem- cyanosis- none, clubbing, none, atrophy- none, strength- nl Neuro- +tremor hands

## 2013-02-24 ENCOUNTER — Telehealth: Payer: Self-pay | Admitting: Internal Medicine

## 2013-02-24 NOTE — Telephone Encounter (Signed)
ATC, NA and no option to leave a msg, WCB 

## 2013-02-24 NOTE — Telephone Encounter (Signed)
I spoke with pt. He stated he currently takes pred 10 mg daily. When he was in 8/26 he thought Dr. Maple Hudson wanted to decrease his prednisone. He stated this was not in his instructions on what he is to do. I do not see in note either. Please advise Dr. Maple Hudson thanks

## 2013-02-24 NOTE — Telephone Encounter (Signed)
Per CY-sorry suggest Prednisone 10 alt+1/2 x 10 mg=5 mg every other day for 2 weeks. If stable and comfortable then try 5 mg daily until next OV.

## 2013-02-25 NOTE — Telephone Encounter (Signed)
i spoke with pt and is aware of recs. Nothing further needed

## 2013-03-06 NOTE — Assessment & Plan Note (Addendum)
Severe obstructive airways disease.  Plan-continue prednisone 10 mg daily after discussion

## 2013-03-06 NOTE — Assessment & Plan Note (Signed)
Plan-refill EpiPen after discussion

## 2013-03-06 NOTE — Assessment & Plan Note (Signed)
I don't see polyps at this time

## 2013-05-05 ENCOUNTER — Other Ambulatory Visit: Payer: Self-pay

## 2013-05-20 ENCOUNTER — Encounter: Payer: Self-pay | Admitting: Interventional Cardiology

## 2013-05-20 ENCOUNTER — Ambulatory Visit (INDEPENDENT_AMBULATORY_CARE_PROVIDER_SITE_OTHER): Payer: Medicare Other | Admitting: Interventional Cardiology

## 2013-05-20 VITALS — BP 102/70 | HR 62 | Ht 71.0 in | Wt 188.0 lb

## 2013-05-20 DIAGNOSIS — R5383 Other fatigue: Secondary | ICD-10-CM

## 2013-05-20 DIAGNOSIS — R5381 Other malaise: Secondary | ICD-10-CM

## 2013-05-20 DIAGNOSIS — R0609 Other forms of dyspnea: Secondary | ICD-10-CM

## 2013-05-20 DIAGNOSIS — I491 Atrial premature depolarization: Secondary | ICD-10-CM

## 2013-05-20 DIAGNOSIS — R0600 Dyspnea, unspecified: Secondary | ICD-10-CM

## 2013-05-20 DIAGNOSIS — I25810 Atherosclerosis of coronary artery bypass graft(s) without angina pectoris: Secondary | ICD-10-CM

## 2013-05-20 NOTE — Patient Instructions (Signed)
Your physician has requested that you have en exercise stress myoview. For further information please visit [email or web address]. Please follow instruction sheet, as given.   

## 2013-05-20 NOTE — Progress Notes (Signed)
Patient ID: [name] [name], male   DOB: 05/04/1928, 79 y.o.   MRN: [id number]     Patient ID: [name] [name] MRN: [id number] DOB/AGE: [birth year] 79 y.o.   Referring Physician Dr. Pete Glatter   Reason for Consultation fatigue, CAD  HPI: 79 y/o with COPD.  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  His activity is limited from lower extremity neuropathy.  He had a stent many years ago.  He followed with Dr. Amil Amen until a few years ago.  I see his wife as a patient.    He has had more fatigue of late. His activity is decreasing due to his shortness of breath. Because of this, he would like to have a cardiac evaluation. Has been several years since he saw Dr. Amil Amen. Prior to his stent, he had some decreased exertion as well.      Current Outpatient Prescriptions  Medication Sig Dispense Refill  . albuterol (PROAIR HFA) 108 (90 BASE) MCG/ACT inhaler 2 puffs 4 times a day as needed  3 Inhaler  prn  . albuterol (PROVENTIL) (2.5 MG/3ML) 0.083% nebulizer solution Inhale 1 vial via HHN 1-2 times a day   Dx : COPD  180 mL  11  . alendronate (FOSAMAX) 70 MG tablet Take 70 mg by mouth every 7 (seven) days.       . ALPRAZolam (XANAX) 0.5 MG tablet Once a day      . budesonide-formoterol (SYMBICORT) 80-4.5 MCG/ACT inhaler Inhale 2 puffs into the lungs 2 (two) times daily.        Marland Kitchen EPINEPHrine (EPI-PEN) 0.3 mg/0.3 mL SOAJ injection Inject 0.3 mLs (0.3 mg total) into the muscle once. For severe allergic reaction  1 Device  prn  . fexofenadine (ALLEGRA) 60 MG tablet 1 twice a daily if needed for allergy  60 tablet  5  . ketoconazole (NIZORAL) 2 % cream       . predniSONE (DELTASONE) 5 MG tablet Take 2-3 tabs daily or as directed.  270 tablet  3  . terazosin (HYTRIN) 5 MG capsule Once a day       . mometasone (NASONEX) 50 MCG/ACT nasal spray Place 2 sprays into the nose daily. 1-2 puffs in each nostril daily  17 g  prn  . tamsulosin (FLOMAX) 0.4 MG CAPS capsule        No  current facility-administered medications for this visit.   Past Medical History  Diagnosis Date  . Asthma   . COPD (chronic obstructive pulmonary disease)   . H/O: asbestos exposure   . Adrenal insufficiency   . Allergic rhinitis   . Chronic sinusitis   . Nasal polyps   . PAC (premature atrial contraction)     EKG 12/03/09    Family History  Problem Relation Age of Onset  . Heart disease Father   . Alzheimer's disease Mother     History   Social History  . Marital Status: Married    Spouse Name: N/A    Number of Children: N/A  . Years of Education: N/A   Occupational History  . Gaffer    Social History Main Topics  . Smoking status: Former Smoker -- 1.00 packs/day for 20 years    Types: Cigarettes  . Smokeless tobacco: Not on file  . Alcohol Use: Not on file  . Drug Use: Not on file  . Sexual Activity: Not on file   Other Topics Concern  . Not on file   Social History  Narrative  . No narrative on file    Past Surgical History  Procedure Laterality Date  . Tonsillectomy    . Hernia repair        (Not in a hospital admission)  Review of systems complete and found to be negative unless listed above .  No nausea, vomiting.  No fever chills, No focal weakness,  No palpitations.  Physical Exam: Filed Vitals:   05/20/13 1106  BP: 102/70  Pulse: 62    Weight: 188 lb (85.276 kg)  Physical exam:  [place]/AT EOMI No JVD, No carotid bruit RRR S1S2 2/6 systolic murmur  No wheezing Soft. NT, nondistended No edema. No focal motor or sensory deficits Normal affect  Labs:   No results found for this basename: WBC, HGB, HCT, MCV, PLT   No results found for this basename: NA, K, CL, CO2, BUN, CREATININE, CALCIUM, LABALBU, PROT, BILITOT, ALKPHOS, ALT, AST, GLUCOSE,  in the last 168 hours No results found for this basename: CKTOTAL, CKMB, CKMBINDEX, TROPONINI    No results found for this basename: CHOL   No results found for this basename: HDL   No  results found for this basename: LDLCALC   No results found for this basename: TRIG   No results found for this basename: CHOLHDL   No results found for this basename: LDLDIRECT       EKG: Normal sinus rhythm, incomplete right bundle branch block, nonspecific ST segment changes  ASSESSMENT AND PLAN:  Coronary artery disease: Prior stent many years ago. He has not had an evaluation for ischemia in several years. Given his new exercise intolerance and worsening dyspnea on exertion, we'll plan for nuclear stress test. He is willing to try to walk on the treadmill.  Shortness of breath: Likely multifactorial including his lung disease.  PACs: He is not symptomatic. No evidence of atrial fibrillation. Signed:   Fredric Mare, MD, [facility] 05/20/2013, 11:47 AM

## 2013-06-07 ENCOUNTER — Encounter (HOSPITAL_COMMUNITY): Payer: Medicare Other

## 2013-06-16 ENCOUNTER — Encounter (HOSPITAL_COMMUNITY): Payer: Medicare Other

## 2013-07-15 ENCOUNTER — Other Ambulatory Visit: Payer: Self-pay | Admitting: Geriatric Medicine

## 2013-07-15 DIAGNOSIS — R1310 Dysphagia, unspecified: Secondary | ICD-10-CM

## 2013-07-18 ENCOUNTER — Ambulatory Visit
Admission: RE | Admit: 2013-07-18 | Discharge: 2013-07-18 | Disposition: A | Discharge status: Home / Self Care | Payer: Medicare HMO | Source: Ambulatory Visit | Attending: Geriatric Medicine | Admitting: Geriatric Medicine

## 2013-07-18 DIAGNOSIS — R1310 Dysphagia, unspecified: Secondary | ICD-10-CM

## 2013-08-05 ENCOUNTER — Ambulatory Visit: Payer: Medicare HMO | Attending: Geriatric Medicine | Admitting: Physical Therapy

## 2013-08-05 DIAGNOSIS — R42 Dizziness and giddiness: Secondary | ICD-10-CM | POA: Insufficient documentation

## 2013-08-05 DIAGNOSIS — H8110 Benign paroxysmal vertigo, unspecified ear: Secondary | ICD-10-CM | POA: Insufficient documentation

## 2013-08-05 DIAGNOSIS — IMO0001 Reserved for inherently not codable concepts without codable children: Secondary | ICD-10-CM | POA: Insufficient documentation

## 2013-08-25 ENCOUNTER — Ambulatory Visit: Payer: Medicare Other | Admitting: Internal Medicine

## 2013-09-22 ENCOUNTER — Encounter: Payer: Self-pay | Admitting: Internal Medicine

## 2013-09-22 ENCOUNTER — Ambulatory Visit (INDEPENDENT_AMBULATORY_CARE_PROVIDER_SITE_OTHER): Payer: Medicare HMO | Admitting: Internal Medicine

## 2013-09-22 VITALS — BP 94/58 | HR 47 | Ht 71.0 in | Wt 190.4 lb

## 2013-09-22 DIAGNOSIS — J449 Chronic obstructive pulmonary disease, unspecified: Secondary | ICD-10-CM

## 2013-09-22 DIAGNOSIS — I491 Atrial premature depolarization: Secondary | ICD-10-CM

## 2013-09-22 DIAGNOSIS — J4489 Other specified chronic obstructive pulmonary disease: Secondary | ICD-10-CM

## 2013-09-22 DIAGNOSIS — J330 Polyp of nasal cavity: Secondary | ICD-10-CM

## 2013-09-22 MED ORDER — PREDNISONE 5 MG PO TABS: ORAL_TABLET | ORAL | Status: DC

## 2013-09-22 NOTE — Progress Notes (Signed)
Subjective:    Patient ID: [name] [name], male    DOB: 08/02/1927, 80 y.o.   MRN: [id number]  HPI 80 yo former smoker followed for chronic obstructive asthma, Allergic rhintiis,  COPD, suspected asbestos exposure after working in [employer]. Had seen Dr Loanne Drilling for endocrinology evaluation concerned about adrenal insufficiency after a lot of prednisone therapy in past years. Now remains on prednisone 10 mg daily. Last here 02/04/2010-no major acute problems since last here. He indicates frustration that he can't exert to the level of digging a hole or using a pick. He can walk on treadmill, go to mail box. Denies cough, wheeze, chest pain. Maybe a little palpitation occasionally. Xolair failed to help. Doesn't notice weather or pollen. He notes that he no longer has the asthma flares that he used to- not in a couple of years. Long discussion of steroid therapy, adrenal insufficiency and etc. He goes to gym and leg presses 400 lbs, but complains that legs feel weak.  Peak Flow best was 450 a few years ago, but is 350 now. PFT 02/04/10- FEV1 1.93/ 70%; FEV1/FVC 0.41; insignificant response to bronchodilator, DLCO 95%.  07/04/11- 68 yo former smoker followed for chronic obstructive asthma, Allergic rhintiis,  COPD, suspected asbestos exposure after working in [employer]. Had seen Dr Loanne Drilling for endocrinology evaluation concerned about adrenal insufficiency after a lot of prednisone therapy in past years. Maintenance prednisone 10 mg. Failed Xolair.  Had flu vaccine and has gotten to the winter so far with no significant colds. Evaluated by neurology for weakness which has been a long-term complaint, especially in his legs. He continues to exercise. He can exercise his legs and does not understand why he gets some easily tired climbing stairs. He is not aware of slow heart rate but felt his heart rate race once after exercise. It resolved gradually and spontaneously.  02/12/12- 65 yo former smoker  followed for chronic obstructive asthma, Allergic rhintiis, nasal polyps,  suspected asbestos exposure after working in [employer]. Had seen Dr Loanne Drilling for endocrinology evaluation concerned about adrenal insufficiency after a lot of prednisone therapy in past years. Maintenance prednisone 10 mg. Failed Xolair. Says his breathing is okay, meaning stable. Mainly he feels limited by leg weakness diagnosed as neuropathy. He no longer notices wheezing or dyspnea at rest. Complains of frontal headaches and nasal stuffiness  08/24/12- 29 yo former smoker followed for chronic obstructive asthma/steroid dependent, Allergic rhintiis, nasal polyps,  suspected asbestos exposure after working in [employer]. follows for: pt states having increased sob upon activity,denies any wheezing,cough.  He has continued prednisone 10 mg daily maintenance. He asks prescription adjustment to allow a few extras. He continues to exercise at the [facility] but complains that his neuropathy limits his walking by weakening his legs. Peak flow max is now around 300. History of supraventricular tachycardia with recent echocardiogram. He is aware of chronic palpitations.  02/22/13- 24 yo former smoker followed for chronic obstructive asthma/steroid dependent, Allergic rhintiis, nasal polyps,  suspected asbestos exposure after working in [employer], hx SVT/ palpitation. FOLLOWS FOR:sob staying same,occass. wheezing,denies cough,cp or tightness Peripheral neuropathy restrict his walking distance now. Chronic complaint of "no energy". He feels he is breathing comfortably through his nose. Keeps EpiPen because of history of insect venom allergy-bee sting- remote. CXR 08/24/12- CHEST - 2 VIEW  Comparison: 06/04/2009  Findings: Lungs are essentially clear. Scattered calcified pleural  plaques. No focal consolidation. No pleural effusion or  pneumothorax.  The heart is top normal  in size.  Mild degenerative changes of the visualized  thoracolumbar spine.  IMPRESSION:  No evidence of acute cardiopulmonary disease.  Original Report Authenticated By: Julian Hy, M.D.  09/22/13- 38 yo former smoker followed for chronic obstructive asthma/steroid dependent, Allergic rhintiis, nasal polyps,  suspected asbestos exposure after working in [employer], hx SVT/ palpitation. Aware of low blood pressure and pulse rate- Dr Felipa Eth. Unsteadiness bothers him-peripheral neuropathy. No real asthma attack in a long time but notices occasional mild wheeze. Continues daily prednisone 10 mg.  ROS-see HPI Constitutional:   No-   weight loss, night sweats, fevers, chills, fatigue, lassitude. HEENT:   + headaches, difficulty swallowing, tooth/dental problems, sore throat,       No-  sneezing, itching, ear ache, +nasal congestion, post nasal drip,  CV:  No-   chest pain, orthopnea, PND, swelling in lower extremities, anasarca,  dizziness, +palpitations Resp: No- acute  shortness of breath with exertion or at rest.              No-   productive cough,  No non-productive cough,  No- coughing up of blood.              No-   change in color of mucus.  + wheezing.   Skin: No-   rash or lesions. GI:  No-   heartburn, indigestion, abdominal pain, nausea, vomiting, GU:  MS:  + joint pain or swelling.  Neuro-     +chronic paresthesias and weakness in lower extremities Psych:  No- change in mood or affect. No depression or anxiety.  No memory loss.  OBJ General- Alert, Oriented, Affect-appropriate, Distress- none acute,  Skin- rash-none, lesions- none, excoriation- none Lymphadenopathy- none Head- atraumatic            Eyes- Gross vision intact, PERRLA, conjunctivae clear secretions            Ears- Hearing, canals-normal            Nose- +congested with Polyps-small, no-Septal dev, mucus,  erosion, perforation             Throat- Mallampati II , mucosa clear , drainage- none, tonsils- atrophic Neck- flexible , trachea midline, no  stridor , thyroid nl, carotid no bruit Chest - symmetrical excursion , unlabored           Heart/CV- ,+ regular bigeminalis, no murmur , no gallop  ,                            - JVD- none , edema- none, stasis changes- none, varices- none           Lung- +diminished but clear to P&A, wheeze- none, cough- none , dullness-none, rub- none           Chest wall-  Abd-  Br/ Gen/ Rectal- Not done, not indicated Extrem- cyanosis- none, clubbing, none, atrophy- none, strength- nl Neuro- +tremor hands

## 2013-09-22 NOTE — Patient Instructions (Signed)
Refill script sent to continue prednisone

## 2013-10-16 NOTE — Assessment & Plan Note (Signed)
Irregular pulse. Not easy to distinguish between frequent extrasystoles and atrial fibrillation if that should develop.

## 2013-10-16 NOTE — Assessment & Plan Note (Signed)
Very stable in recent years Plan-continue maintenance prednisone with steroid talk including possible connection to his peripheral neuropathy

## 2013-10-16 NOTE — Assessment & Plan Note (Signed)
Smaller recently. Discussed role of steroid therapy/sprays

## 2013-10-28 ENCOUNTER — Other Ambulatory Visit: Payer: Self-pay | Admitting: *Deleted

## 2013-10-28 MED ORDER — ALBUTEROL SULFATE (2.5 MG/3ML) 0.083% IN NEBU: INHALATION_SOLUTION | RESPIRATORY_TRACT | Status: DC

## 2013-11-03 ENCOUNTER — Telehealth: Payer: Self-pay | Admitting: Internal Medicine

## 2013-11-03 NOTE — Telephone Encounter (Signed)
Spoke with the pt  He states that [facility] told him this am that they never received his albuterol neb sol refill  Our records indicate that we called this refill in to [facility] on 10/28/13  I called [facility] and they advised no record of refill  Spoke with Pharmacist, Ronalee Belts and gave VO for 1 mo with 11 RF  Pt aware and states that nothing further needed

## 2014-02-09 ENCOUNTER — Other Ambulatory Visit: Payer: Self-pay | Admitting: Internal Medicine

## 2014-07-13 ENCOUNTER — Other Ambulatory Visit: Payer: Self-pay | Admitting: Internal Medicine

## 2014-08-29 ENCOUNTER — Other Ambulatory Visit (HOSPITAL_COMMUNITY): Payer: Self-pay | Admitting: Geriatric Medicine

## 2014-08-29 ENCOUNTER — Ambulatory Visit (HOSPITAL_COMMUNITY): Payer: PPO | Attending: Cardiology | Admitting: Radiology

## 2014-08-29 DIAGNOSIS — I359 Nonrheumatic aortic valve disorder, unspecified: Secondary | ICD-10-CM | POA: Insufficient documentation

## 2014-08-29 NOTE — Progress Notes (Signed)
Echocardiogram performed.  

## 2014-10-05 ENCOUNTER — Ambulatory Visit (INDEPENDENT_AMBULATORY_CARE_PROVIDER_SITE_OTHER): Payer: PPO | Admitting: Interventional Cardiology

## 2014-10-05 ENCOUNTER — Encounter: Payer: Self-pay | Admitting: Interventional Cardiology

## 2014-10-05 VITALS — BP 120/74 | HR 92 | Ht 71.0 in | Wt 191.8 lb

## 2014-10-05 DIAGNOSIS — R0602 Shortness of breath: Secondary | ICD-10-CM

## 2014-10-05 DIAGNOSIS — I491 Atrial premature depolarization: Secondary | ICD-10-CM | POA: Diagnosis not present

## 2014-10-05 DIAGNOSIS — R002 Palpitations: Secondary | ICD-10-CM | POA: Diagnosis not present

## 2014-10-05 DIAGNOSIS — I2510 Atherosclerotic heart disease of native coronary artery without angina pectoris: Secondary | ICD-10-CM | POA: Insufficient documentation

## 2014-10-05 NOTE — Progress Notes (Signed)
Patient ID: [name] [name], male   DOB: 1928-04-06, 81 y.o.   MRN: [id number]     Patient ID: [name] [name] MRN: [id number] DOB/AGE: 31-May-1928 81 y.o.   Referring Physician Dr. Felipa Eth   Reason for Consultation fatigue, CAD, palpitations  HPI: 81 y/o with COPD.  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  His activity is limited from lower extremity neuropathy.  He had a stent many years ago.  He followed with Dr. Leonia Reeves until a few years ago.  I see his wife as a patient.    He has had more fatigue of late. His activity is decreasing due to his shortness of breath. Because of this, he would like to have a cardiac evaluation. Has been several years since he saw Dr. Leonia Reeves. Prior to his stent, he had some decreased exertion as well. No chest pain.   Recent echo in 3/16 showed normal LV function and moderate aortic stenosis.  Neuropathy is limiting exercise.   In the morning, he feels that his HR is irregular and elevated.  He will bear down and sx get better.        Current Outpatient Prescriptions  Medication Sig Dispense Refill  . albuterol (PROVENTIL) (2.5 MG/3ML) 0.083% nebulizer solution Inhale 1 vial via HHN 1-2 times a day   Dx : COPD 540 mL 1  . alendronate (FOSAMAX) 70 MG tablet Take 70 mg by mouth every 7 (seven) days.     . ALPRAZolam (XANAX) 0.5 MG tablet Once a day    . budesonide-formoterol (SYMBICORT) 80-4.5 MCG/ACT inhaler Inhale 2 puffs into the lungs 2 (two) times daily.      Marland Kitchen EPINEPHrine (EPI-PEN) 0.3 mg/0.3 mL SOAJ injection Inject 0.3 mLs (0.3 mg total) into the muscle once. For severe allergic reaction 1 Device prn  . ketoconazole (NIZORAL) 2 % cream     . predniSONE (DELTASONE) 5 MG tablet TAKE TWO OR THREE TABLETS BY MOUTH DAILY OR AS DIRECTED 270 tablet 0  . terazosin (HYTRIN) 5 MG capsule Once a day     . albuterol (PROAIR HFA) 108 (90 BASE) MCG/ACT inhaler 2 puffs 4 times a day as needed (Patient not taking: Reported on  10/05/2014) 3 Inhaler prn  . fexofenadine (ALLEGRA) 60 MG tablet 1 twice a daily if needed for allergy 60 tablet 5  . mometasone (NASONEX) 50 MCG/ACT nasal spray Place 2 sprays into the nose daily. 1-2 puffs in each nostril daily (Patient taking differently: Place 2 sprays into the nose as needed. 1-2 puffs in each nostril daily) 17 g prn   No current facility-administered medications for this visit.   Past Medical History  Diagnosis Date  . Asthma   . COPD (chronic obstructive pulmonary disease)   . H/O: asbestos exposure   . Adrenal insufficiency   . Allergic rhinitis   . Chronic sinusitis   . Nasal polyps   . PAC (premature atrial contraction)     EKG 12/03/09    Family History  Problem Relation Age of Onset  . Heart disease Father   . Alzheimer's disease Mother   . Heart disease Paternal Uncle     History   Social History  . Marital Status: Married    Spouse Name: N/A  . Number of Children: N/A  . Years of Education: N/A   Occupational History  . Armed forces training and education officer    Social History Main Topics  . Smoking status: Former Smoker -- 1.00 packs/day for 20 years  Types: Cigarettes  . Smokeless tobacco: Not on file  . Alcohol Use: Not on file  . Drug Use: Not on file  . Sexual Activity: Not on file   Other Topics Concern  . Not on file   Social History Narrative    Past Surgical History  Procedure Laterality Date  . Tonsillectomy    . Hernia repair        (Not in a hospital admission)  Review of systems complete and found to be negative unless listed above .  No nausea, vomiting.  No fever chills, No focal weakness,  No palpitations.  Physical Exam: Filed Vitals:   10/05/14 1356  BP: 120/74  Pulse: 92    Weight: 191 lb 12.8 oz (87 kg)  Physical exam:  [place]/AT EOMI No JVD, No carotid bruit RRR T4H9 2/6 systolic murmur  No wheezing Soft. NT, nondistended No edema. No focal motor or sensory deficits Normal affect  Labs:   No results found for: WBC No  results for input(s): NA, K, CL, CO2, BUN, CREATININE, CALCIUM, PROT, BILITOT, ALKPHOS, ALT, AST, GLUCOSE in the last 168 hours.  Invalid input(s): LABALBU No results found for: CKTOTAL  No results found for: CHOL No results found for: HDL No results found for: LDLCALC No results found for: TRIG No results found for: CHOLHDL No results found for: LDLDIRECT     EKG: Normal sinus rhythm, incomplete right bundle branch block, nonspecific ST segment changes  ASSESSMENT AND PLAN:  Coronary artery disease: Prior stent many years ago. He has not had an evaluation for ischemia in several years. Most recent nuclear stress test in 2011 was negative for ischemia and he had normal LV function.  Reconsider stress test or heart cath if sx persist. The risks and benefits cardiac catheter explained to the patient. He will consider his options and see if he feels any better. We'll also see if the monitor shows anything that could be addressed.  Shortness of breath: Likely multifactorial including his lung disease. Flow meter show low readings due to his lung disease.  PACs: He is not symptomatic. No evidence of atrial fibrillation.  However, he is having more fluttering in the morning.  Will check 30 day event monitor to see what is happening.    Signed:   Mina Marble, MD, [facility] 10/05/2014, 2:27 PM

## 2014-10-05 NOTE — Patient Instructions (Signed)
Medication Instructions:  NO CHANGES   Labwork: NONE TODAY   Testing/Procedures: Your physician has recommended that you wear an event monitor.30 DAYS  Event monitors are medical devices that record the heart's electrical activity. Doctors most often [place] these monitors to diagnose arrhythmias. Arrhythmias are problems with the speed or rhythm of the heartbeat. The monitor is a small, portable device. You can wear one while you do your normal daily activities. This is usually used to diagnose what is causing palpitations/syncope (passing out).    Follow-Up: IN 3 MONTHS WITH DR Irish Lack OR NEXT AVAILABLE APPOINTMENT  Any Other Special Instructions Will Be Listed Below (If Applicable). NONE AT THIS TIME

## 2014-11-22 ENCOUNTER — Other Ambulatory Visit: Payer: Self-pay | Admitting: Internal Medicine

## 2014-11-23 ENCOUNTER — Other Ambulatory Visit: Payer: Self-pay | Admitting: Internal Medicine

## 2014-11-23 ENCOUNTER — Telehealth: Payer: Self-pay | Admitting: Internal Medicine

## 2014-11-23 MED ORDER — ALBUTEROL SULFATE (2.5 MG/3ML) 0.083% IN NEBU: INHALATION_SOLUTION | RESPIRATORY_TRACT | Status: DC

## 2014-11-23 NOTE — Telephone Encounter (Signed)
Spoke with pt, requesting refill on albuterol neb to be sent to [facility].  I advised pt that he had not been seen in over a year and had no follow up appt, and that we need to schedule and keep the rov for refills.  Pt scheduled next available with CY (August 19th), and enough albuterol filled to last until then.    This refill has been ok'ed by CY to send until ov.  This has been sent to [facility].  Nothing further needed.

## 2014-11-28 ENCOUNTER — Telehealth: Payer: Self-pay | Admitting: Internal Medicine

## 2014-11-28 MED ORDER — HYDROCOD POLST-CPM POLST ER 10-8 MG/5ML PO SUER: 5.0000 mL | Freq: Two times a day (BID) | ORAL | Status: DC | PRN

## 2014-11-28 NOTE — Telephone Encounter (Signed)
Ok for Tussionex refill

## 2014-11-28 NOTE — Telephone Encounter (Signed)
rx printed and placed on CY's cart for signature. Pt aware of refill.  Nothing further needed.

## 2014-11-28 NOTE — Telephone Encounter (Signed)
Needs refill on Tussinex, coughing a lot, says this medication is the only thing that suppresses the cough at night to help him sleep.  Wants a handwritten Rx to pick up.  No Known Allergies  Current Outpatient Prescriptions on File Prior to Visit  Medication Sig Dispense Refill  . albuterol (PROAIR HFA) 108 (90 BASE) MCG/ACT inhaler 2 puffs 4 times a day as needed (Patient not taking: Reported on 10/05/2014) 3 Inhaler prn  . albuterol (PROVENTIL) (2.5 MG/3ML) 0.083% nebulizer solution Inhale 1 vial via HHN 1-2 times a day   Dx : COPD 540 mL 1  . alendronate (FOSAMAX) 70 MG tablet Take 70 mg by mouth every 7 (seven) days.     . ALPRAZolam (XANAX) 0.5 MG tablet Once a day    . budesonide-formoterol (SYMBICORT) 80-4.5 MCG/ACT inhaler Inhale 2 puffs into the lungs 2 (two) times daily.      Marland Kitchen EPINEPHrine (EPI-PEN) 0.3 mg/0.3 mL SOAJ injection Inject 0.3 mLs (0.3 mg total) into the muscle once. For severe allergic reaction 1 Device prn  . fexofenadine (ALLEGRA) 60 MG tablet 1 twice a daily if needed for allergy 60 tablet 5  . ketoconazole (NIZORAL) 2 % cream     . mometasone (NASONEX) 50 MCG/ACT nasal spray Place 2 sprays into the nose daily. 1-2 puffs in each nostril daily (Patient taking differently: Place 2 sprays into the nose as needed. 1-2 puffs in each nostril daily) 17 g prn  . predniSONE (DELTASONE) 5 MG tablet TAKE TWO OR THREE TABLETS BY MOUTH DAILY OR AS DIRECTED 270 tablet 0  . terazosin (HYTRIN) 5 MG capsule Once a day      No current facility-administered medications on file prior to visit.

## 2015-02-16 ENCOUNTER — Ambulatory Visit (INDEPENDENT_AMBULATORY_CARE_PROVIDER_SITE_OTHER): Payer: PPO | Admitting: Internal Medicine

## 2015-02-16 ENCOUNTER — Encounter: Payer: Self-pay | Admitting: Internal Medicine

## 2015-02-16 ENCOUNTER — Ambulatory Visit (INDEPENDENT_AMBULATORY_CARE_PROVIDER_SITE_OTHER)
Admission: RE | Admit: 2015-02-16 | Discharge: 2015-02-16 | Disposition: A | Discharge status: Home / Self Care | Payer: PPO | Source: Ambulatory Visit | Attending: Internal Medicine | Admitting: Internal Medicine

## 2015-02-16 VITALS — BP 112/82 | HR 73 | Ht 71.0 in | Wt 177.6 lb

## 2015-02-16 DIAGNOSIS — J449 Chronic obstructive pulmonary disease, unspecified: Secondary | ICD-10-CM

## 2015-02-16 DIAGNOSIS — J330 Polyp of nasal cavity: Secondary | ICD-10-CM | POA: Diagnosis not present

## 2015-02-16 MED ORDER — UMECLIDINIUM-VILANTEROL 62.5-25 MCG/INH IN AEPB
1.0000 | INHALATION_SPRAY | Freq: Every day | RESPIRATORY_TRACT | Status: DC
Start: 2015-02-16 — End: 2015-07-30

## 2015-02-16 MED ORDER — PREDNISONE 5 MG PO TABS: ORAL_TABLET | ORAL | Status: DC

## 2015-02-16 NOTE — Patient Instructions (Addendum)
Sample Anoro Ellipta inhaler   Inhale 1 puff, once daily- maintenance       Try this instead of Symibcort  Order CXR    Dx asthma with COPD  Order- walk test on room air- done  Order- schedule ONOX on room air

## 2015-02-16 NOTE — Progress Notes (Signed)
Subjective:    Patient ID: [name] [name], male    DOB: 08/02/1927, 81 y.o.   MRN: [id number]  HPI 81 yo former smoker followed for chronic obstructive asthma, Allergic rhintiis,  COPD, suspected asbestos exposure after working in [employer]. Had seen Dr Loanne Drilling for endocrinology evaluation concerned about adrenal insufficiency after a lot of prednisone therapy in past years. Now remains on prednisone 10 mg daily. Last here 02/04/2010-no major acute problems since last here. He indicates frustration that he can't exert to the level of digging a hole or using a pick. He can walk on treadmill, go to mail box. Denies cough, wheeze, chest pain. Maybe a little palpitation occasionally. Xolair failed to help. Doesn't notice weather or pollen. He notes that he no longer has the asthma flares that he used to- not in a couple of years. Long discussion of steroid therapy, adrenal insufficiency and etc. He goes to gym and leg presses 400 lbs, but complains that legs feel weak.  Peak Flow best was 450 a few years ago, but is 350 now. PFT 02/04/10- FEV1 1.93/ 70%; FEV1/FVC 0.41; insignificant response to bronchodilator, DLCO 95%.  07/04/11- 68 yo former smoker followed for chronic obstructive asthma, Allergic rhintiis,  COPD, suspected asbestos exposure after working in [employer]. Had seen Dr Loanne Drilling for endocrinology evaluation concerned about adrenal insufficiency after a lot of prednisone therapy in past years. Maintenance prednisone 10 mg. Failed Xolair.  Had flu vaccine and has gotten to the winter so far with no significant colds. Evaluated by neurology for weakness which has been a long-term complaint, especially in his legs. He continues to exercise. He can exercise his legs and does not understand why he gets some easily tired climbing stairs. He is not aware of slow heart rate but felt his heart rate race once after exercise. It resolved gradually and spontaneously.  02/12/12- 65 yo former smoker  followed for chronic obstructive asthma, Allergic rhintiis, nasal polyps,  suspected asbestos exposure after working in [employer]. Had seen Dr Loanne Drilling for endocrinology evaluation concerned about adrenal insufficiency after a lot of prednisone therapy in past years. Maintenance prednisone 10 mg. Failed Xolair. Says his breathing is okay, meaning stable. Mainly he feels limited by leg weakness diagnosed as neuropathy. He no longer notices wheezing or dyspnea at rest. Complains of frontal headaches and nasal stuffiness  08/24/12- 29 yo former smoker followed for chronic obstructive asthma/steroid dependent, Allergic rhintiis, nasal polyps,  suspected asbestos exposure after working in [employer]. follows for: pt states having increased sob upon activity,denies any wheezing,cough.  He has continued prednisone 10 mg daily maintenance. He asks prescription adjustment to allow a few extras. He continues to exercise at the "[employer] but complains that his neuropathy limits his walking by weakening his legs. Peak flow max is now around 300. History of supraventricular tachycardia with recent echocardiogram. He is aware of chronic palpitations.  02/22/13- 24 yo former smoker followed for chronic obstructive asthma/steroid dependent, Allergic rhintiis, nasal polyps,  suspected asbestos exposure after working in [employer], hx SVT/ palpitation. FOLLOWS FOR:sob staying same,occass. wheezing,denies cough,cp or tightness Peripheral neuropathy restrict his walking distance now. Chronic complaint of "no energy". He feels he is breathing comfortably through his nose. Keeps EpiPen because of history of insect venom allergy-bee sting- remote. CXR 08/24/12- CHEST - 2 VIEW  Comparison: 06/04/2009  Findings: Lungs are essentially clear. Scattered calcified pleural  plaques. No focal consolidation. No pleural effusion or  pneumothorax.  The heart is top normal  in size.  Mild degenerative changes of the visualized  thoracolumbar spine.  IMPRESSION:  No evidence of acute cardiopulmonary disease.  Original Report Authenticated By: Julian Hy, M.D.  09/22/13- 64 yo former smoker followed for chronic obstructive asthma/steroid dependent, Allergic rhintiis, nasal polyps,  suspected asbestos exposure after working in [employer], hx SVT/ palpitation. Aware of low blood pressure and pulse rate- Dr Felipa Eth. Unsteadiness bothers him-peripheral neuropathy. No real asthma attack in a long time but notices occasional mild wheeze. Continues daily prednisone 10 mg.  02/16/15- 12 yo former smoker followed for chronic obstructive asthma/steroid dependent, Allergic rhintiis, nasal polyps,  suspected asbestos exposure after working in [employer], hx SVT/ palpitation. FOLLOWS FOR: Pt states his breathing is not well. Pt c/o increase in DOE. Pt states he has noticed a decrease in breathing when he uses his peak flow meter. Pt denies CP/tightness and cough.  No acute change or event. He just seems to feel somewhat more aware of dyspnea on exertion in recent months-possibly the heat. Has slowly lost some weight. Continues home oxygen 2 L for sleep. Walking is limited more by peripheral neuropathy than by dyspnea. 6MWT 02/16/15- 99%, 98%, 98%, 555 feet, no oxygen limitation. Walked at slow pace with an unsteady gait  ROS-see HPI Constitutional:   + weight loss, night sweats, fevers, chills, fatigue, lassitude. HEENT:   + headaches, difficulty swallowing, tooth/dental problems, sore throat,       No-  sneezing, itching, ear ache, +nasal congestion, post nasal drip,  CV:  No-   chest pain, orthopnea, PND, swelling in lower extremities, anasarca,  dizziness, +palpitations Resp: + shortness of breath with exertion or at rest.              No-   productive cough,  No non-productive cough,  No- coughing up of blood.              No-   change in color of mucus.  + wheezing.   Skin: No-   rash or lesions. GI:  No-    heartburn, indigestion, abdominal pain, nausea, vomiting, GU:  MS:  + joint pain or swelling.  Neuro-     +chronic paresthesias and weakness in lower extremities Psych:  No- change in mood or affect. No depression or anxiety.  No memory loss.  OBJ General- Alert, Oriented, Affect-appropriate, Distress- none acute, + weight loss Skin- rash-none, lesions- none, excoriation- none Lymphadenopathy- none Head- atraumatic            Eyes- Gross vision intact, PERRLA, conjunctivae clear secretions            Ears- Hearing, canals-normal            Nose- + mild turbinate edema, polyps-none seen this visit                        no-Septal dev, mucus,  erosion, perforation             Throat- Mallampati II , mucosa clear , drainage- none, tonsils- atrophic Neck- flexible , trachea midline, no stridor , thyroid nl, carotid no bruit Chest - symmetrical excursion , unlabored           Heart/CV- ,+ regular with frequent extrasystoles, no murmur , no gallop  ,                            - JVD- none , edema- none, stasis  changes- none, varices- none           Lung- +diminished but clear to P&A, wheeze- none, cough- none , dullness-none, rub- none           Chest wall-  Abd-  Br/ Gen/ Rectal- Not done, not indicated Extrem- cyanosis- none, clubbing, none, atrophy- none, strength- nl Neuro- +tremor hands

## 2015-02-17 ENCOUNTER — Other Ambulatory Visit: Payer: Self-pay | Admitting: Internal Medicine

## 2015-02-19 ENCOUNTER — Telehealth: Payer: Self-pay | Admitting: Internal Medicine

## 2015-02-19 DIAGNOSIS — J449 Chronic obstructive pulmonary disease, unspecified: Secondary | ICD-10-CM

## 2015-02-19 MED ORDER — IPRATROPIUM BROMIDE 0.02 % IN SOLN: 0.5000 mg | Freq: Three times a day (TID) | RESPIRATORY_TRACT | Status: DC | PRN

## 2015-02-19 MED ORDER — IPRATROPIUM BROMIDE 0.02 % IN SOLN: 0.5000 mg | Freq: Four times a day (QID) | RESPIRATORY_TRACT | Status: DC

## 2015-02-19 NOTE — Telephone Encounter (Signed)
Patient says that he discussed Ipratropium Bromide at last OV.  Patient wants to order some through [facility] and wants to know if Dr. Annamaria Boots is okay with him using Ipratropium Bromide.  Also, Patient is going to see Dr. Felipa Eth and would like his last X-ray sent to him. (copy of xray report faxed to Dr. Felipa Eth)  Dr. Annamaria Boots, please advise.  Current Outpatient Prescriptions on File Prior to Visit  Medication Sig Dispense Refill  . albuterol (PROAIR HFA) 108 (90 BASE) MCG/ACT inhaler 2 puffs 4 times a day as needed (Patient not taking: Reported on 02/16/2015) 3 Inhaler prn  . albuterol (PROVENTIL) (2.5 MG/3ML) 0.083% nebulizer solution Inhale 1 vial via HHN 1-2 times a day   Dx : COPD 540 mL 1  . ALPRAZolam (XANAX) 0.5 MG tablet Once a day    . budesonide-formoterol (SYMBICORT) 80-4.5 MCG/ACT inhaler Inhale 2 puffs into the lungs 2 (two) times daily.      . chlorpheniramine-HYDROcodone (TUSSIONEX PENNKINETIC ER) 10-8 MG/5ML SUER Take 5 mLs by mouth every 12 (twelve) hours as needed for cough. 120 mL 0  . EPINEPHrine (EPI-PEN) 0.3 mg/0.3 mL SOAJ injection Inject 0.3 mLs (0.3 mg total) into the muscle once. For severe allergic reaction 1 Device prn  . fexofenadine (ALLEGRA) 60 MG tablet 1 twice a daily if needed for allergy 60 tablet 5  . ketoconazole (NIZORAL) 2 % cream     . mometasone (NASONEX) 50 MCG/ACT nasal spray Place 2 sprays into the nose daily. 1-2 puffs in each nostril daily (Patient taking differently: Place 2 sprays into the nose as needed. 1-2 puffs in each nostril daily) 17 g prn  . predniSONE (DELTASONE) 5 MG tablet TAKE TWO OR THREE TABLETS BY MOUTH DAILY OR AS DIRECTED 90 tablet 3  . terazosin (HYTRIN) 5 MG capsule Once a day     . Umeclidinium-Vilanterol 62.5-25 MCG/INH AEPB Inhale 1 puff into the lungs daily. 1 each 0   No current facility-administered medications on file prior to visit.   No Known Allergies

## 2015-02-19 NOTE — Telephone Encounter (Signed)
Per CY- ok to send Atrovent neb solution, #50, 1 every 8 hours prn, refill prn. CXR report sent to Dr. Felipa Eth.   Called and spoke to pt. Informed him of the Atrovent neb solution per CY. Rx signed and given to [facility] with a new DME order. Pt verbalized understanding and denied any further questions or concerns at this time.

## 2015-02-20 NOTE — Assessment & Plan Note (Signed)
No visible polyps on this exam

## 2015-02-20 NOTE — Assessment & Plan Note (Addendum)
He thinks his oxygen saturation is usually in the 90s by his home oximeter . No specific activity during which he is clearly more short of breath. Plan-chest x-ray, overnight oximetry on room air, walk test on room air, try sample Anoro

## 2015-02-23 ENCOUNTER — Telehealth: Payer: Self-pay | Admitting: Internal Medicine

## 2015-02-23 NOTE — Telephone Encounter (Signed)
Per CY Pt qualifies for sleep 2L O2.   Called and spoke with pt. Reviewed results and recs.  Pt states he already uses 2L of O2. He has his own concentrator and orders supplies through [facility]. Pt voiced understanding and had no further questions. Nothing further needed

## 2015-04-02 ENCOUNTER — Encounter: Payer: Self-pay | Admitting: Internal Medicine

## 2015-06-08 ENCOUNTER — Other Ambulatory Visit: Payer: Self-pay | Admitting: Internal Medicine

## 2015-06-19 ENCOUNTER — Ambulatory Visit: Payer: PPO | Admitting: Internal Medicine

## 2015-07-30 ENCOUNTER — Encounter: Payer: Self-pay | Admitting: Internal Medicine

## 2015-07-30 ENCOUNTER — Ambulatory Visit (INDEPENDENT_AMBULATORY_CARE_PROVIDER_SITE_OTHER): Payer: PPO | Admitting: Internal Medicine

## 2015-07-30 VITALS — BP 116/64 | HR 51 | Ht 71.0 in | Wt 187.0 lb

## 2015-07-30 DIAGNOSIS — E273 Drug-induced adrenocortical insufficiency: Secondary | ICD-10-CM

## 2015-07-30 DIAGNOSIS — I491 Atrial premature depolarization: Secondary | ICD-10-CM

## 2015-07-30 DIAGNOSIS — J45909 Unspecified asthma, uncomplicated: Secondary | ICD-10-CM | POA: Diagnosis not present

## 2015-07-30 DIAGNOSIS — J449 Chronic obstructive pulmonary disease, unspecified: Secondary | ICD-10-CM

## 2015-07-30 MED ORDER — UMECLIDINIUM-VILANTEROL 62.5-25 MCG/INH IN AEPB: INHALATION_SPRAY | RESPIRATORY_TRACT | Status: DC

## 2015-07-30 NOTE — Progress Notes (Signed)
Subjective:    Patient ID: [name] [name], male    DOB: 08/02/1927, 82 y.o.   MRN: [id number]  HPI 82 yo former smoker followed for chronic obstructive asthma, Allergic rhintiis,  COPD, suspected asbestos exposure after working in [employer]. Had seen Dr Loanne Drilling for endocrinology evaluation concerned about adrenal insufficiency after a lot of prednisone therapy in past years. Now remains on prednisone 10 mg daily. Last here 02/04/2010-no major acute problems since last here. He indicates frustration that he can't exert to the level of digging a hole or using a pick. He can walk on treadmill, go to mail box. Denies cough, wheeze, chest pain. Maybe a little palpitation occasionally. Xolair failed to help. Doesn't notice weather or pollen. He notes that he no longer has the asthma flares that he used to- not in a couple of years. Long discussion of steroid therapy, adrenal insufficiency and etc. He goes to gym and leg presses 400 lbs, but complains that legs feel weak.  Peak Flow best was 450 a few years ago, but is 350 now. PFT 02/04/10- FEV1 1.93/ 70%; FEV1/FVC 0.41; insignificant response to bronchodilator, DLCO 95%.  07/04/11- 68 yo former smoker followed for chronic obstructive asthma, Allergic rhintiis,  COPD, suspected asbestos exposure after working in [employer]. Had seen Dr Loanne Drilling for endocrinology evaluation concerned about adrenal insufficiency after a lot of prednisone therapy in past years. Maintenance prednisone 10 mg. Failed Xolair.  Had flu vaccine and has gotten to the winter so far with no significant colds. Evaluated by neurology for weakness which has been a long-term complaint, especially in his legs. He continues to exercise. He can exercise his legs and does not understand why he gets some easily tired climbing stairs. He is not aware of slow heart rate but felt his heart rate race once after exercise. It resolved gradually and spontaneously.  02/12/12- 65 yo former smoker  followed for chronic obstructive asthma, Allergic rhintiis, nasal polyps,  suspected asbestos exposure after working in [employer]. Had seen Dr Loanne Drilling for endocrinology evaluation concerned about adrenal insufficiency after a lot of prednisone therapy in past years. Maintenance prednisone 10 mg. Failed Xolair. Says his breathing is okay, meaning stable. Mainly he feels limited by leg weakness diagnosed as neuropathy. He no longer notices wheezing or dyspnea at rest. Complains of frontal headaches and nasal stuffiness  08/24/12- 29 yo former smoker followed for chronic obstructive asthma/steroid dependent, Allergic rhintiis, nasal polyps,  suspected asbestos exposure after working in [employer]. follows for: pt states having increased sob upon activity,denies any wheezing,cough.  He has continued prednisone 10 mg daily maintenance. He asks prescription adjustment to allow a few extras. He continues to exercise at the "[employer] but complains that his neuropathy limits his walking by weakening his legs. Peak flow max is now around 300. History of supraventricular tachycardia with recent echocardiogram. He is aware of chronic palpitations.  02/22/13- 24 yo former smoker followed for chronic obstructive asthma/steroid dependent, Allergic rhintiis, nasal polyps,  suspected asbestos exposure after working in [employer], hx SVT/ palpitation. FOLLOWS FOR:sob staying same,occass. wheezing,denies cough,cp or tightness Peripheral neuropathy restrict his walking distance now. Chronic complaint of "no energy". He feels he is breathing comfortably through his nose. Keeps EpiPen because of history of insect venom allergy-bee sting- remote. CXR 08/24/12- CHEST - 2 VIEW  Comparison: 06/04/2009  Findings: Lungs are essentially clear. Scattered calcified pleural  plaques. No focal consolidation. No pleural effusion or  pneumothorax.  The heart is top normal  in size.  Mild degenerative changes of the visualized  thoracolumbar spine.  IMPRESSION:  No evidence of acute cardiopulmonary disease.  Original Report Authenticated By: Julian Hy, M.D.  09/22/13- 21 yo former smoker followed for chronic obstructive asthma/steroid dependent, Allergic rhintiis, nasal polyps,  suspected asbestos exposure after working in [employer], hx SVT/ palpitation. Aware of low blood pressure and pulse rate- Dr Felipa Eth. Unsteadiness bothers him-peripheral neuropathy. No real asthma attack in a long time but notices occasional mild wheeze. Continues daily prednisone 10 mg.  02/16/15- 55 yo former smoker followed for chronic obstructive asthma/steroid dependent, Allergic rhintiis, nasal polyps,  suspected asbestos exposure after working in [employer], hx SVT/ palpitation. FOLLOWS FOR: Pt states his breathing is not well. Pt c/o increase in DOE. Pt states he has noticed a decrease in breathing when he uses his peak flow meter. Pt denies CP/tightness and cough.  No acute change or event. He just seems to feel somewhat more aware of dyspnea on exertion in recent months-possibly the heat. Has slowly lost some weight. Continues home oxygen 2 L for sleep. Walking is limited more by peripheral neuropathy than by dyspnea. 6MWT 02/16/15- 99%, 98%, 98%, 555 feet, no oxygen limitation. Walked at slow pace with an unsteady gait  07/30/2015-82 year old male former smoker followed for chronic obstructive asthma/steroid dependent, allergic rhinitis, nasal polyps, suspected asbestos exposure after working in [employer], complicated by history SVT/palpitation, moderate aortic stenosis, adrenal insufficiency O2 2L/ sleep/no DME company-he owns his concentrator Overnight oximetry 02/21/2015 did qualify for sleep O2 Maintenance prednisone 10 mg daily adrenal insufficient FOLLOWS FOR: Pt states his breathing is at baseline - DOE. Pt c/o mild dry cough. Pt denies CP/tightness, chest congestion, f/c/s. Pt states he is using his peak flow  meter daily.  Chronic complaint of "no energy". Known aortic stenosis and peripheral neuropathy. CXR 02/16/2015 IMPRESSION: 1. Bilateral calcified pleural plaques consistent with scarring. 2. No acute cardiopulmonary disease identified. Electronically Signed  By: Marcello Moores Register  On: 02/16/2015 12:03  ROS-see HPI Constitutional:   + weight loss, night sweats, fevers, chills, fatigue, lassitude. HEENT:   + headaches, difficulty swallowing, tooth/dental problems, sore throat,       No-  sneezing, itching, ear ache, +nasal congestion, post nasal drip,  CV:  No-   chest pain, orthopnea, PND, swelling in lower extremities, anasarca,  dizziness, +palpitations Resp: + shortness of breath with exertion or at rest.              No-   productive cough,  No non-productive cough,  No- coughing up of blood.              No-   change in color of mucus.  + wheezing.   Skin: No-   rash or lesions. GI:  No-   heartburn, indigestion, abdominal pain, nausea, vomiting, GU:  MS:  + joint pain or swelling.  Neuro-     +chronic paresthesias and weakness in lower extremities Psych:  No- change in mood or affect. No depression or anxiety.  No memory loss.  OBJ General- Alert, Oriented, Affect-appropriate, Distress- none acute, + weight loss Skin- rash-none, lesions- none, excoriation- none Lymphadenopathy- none Head- atraumatic            Eyes- Gross vision intact, PERRLA, conjunctivae clear secretions            Ears- Hearing, canals-normal            Nose- + mild turbinate edema, polyps-none seen this visit  no-Septal dev, mucus,  erosion, perforation             Throat- Mallampati II , mucosa clear , drainage- none, tonsils- atrophic Neck- flexible , trachea midline, no stridor , thyroid nl, carotid no bruit Chest - symmetrical excursion , unlabored           Heart/CV- ,+ Irregular with frequent extrasystoles, Q000111Q systolic murmur , no gallop  ,                            -  JVD- none , edema- none, stasis changes- none, varices- none           Lung- +diminished but clear to P&A, wheeze- none, cough- none , dullness-none, rub- none           Chest wall-  Abd-  Br/ Gen/ Rectal- Not done, not indicated Extrem- cyanosis- none, clubbing, none, atrophy- none, strength- nl Neuro- +tremor hands

## 2015-07-30 NOTE — Patient Instructions (Signed)
Sample and script Anoro Ellipta     Inhale 1 puff, one time daily     Try this as a source of both albuterol and ipratropium effects.   Ask Dr Felipa Eth to notice your heart rhythm. It may be the same as before, but I can't rule-out atrial fibrillation on exam today.  Please call as needed

## 2015-08-04 NOTE — Assessment & Plan Note (Signed)
Pulse regular with very frequent extra beats previously identified as supraventricular. Could easily be mistaken for atrial fibrillation and may degenerate into that in the future.

## 2015-08-04 NOTE — Assessment & Plan Note (Signed)
After very many years on steroid therapy for reactive airways disease, endocrinology assessed. He is now on chronic maintenance prednisone.

## 2015-08-04 NOTE — Assessment & Plan Note (Signed)
Obstructive airways disease but activity also limited by his known peripheral neuropathy and his moderate aortic stenosis. He likes his current combination of Atrovent and albuterol inhalers. Plan-trial Arnuity Ellipta inhaler for comparison

## 2015-08-14 DIAGNOSIS — K469 Unspecified abdominal hernia without obstruction or gangrene: Secondary | ICD-10-CM | POA: Diagnosis not present

## 2015-08-22 DIAGNOSIS — K4090 Unilateral inguinal hernia, without obstruction or gangrene, not specified as recurrent: Secondary | ICD-10-CM | POA: Diagnosis not present

## 2015-09-03 DIAGNOSIS — H00014 Hordeolum externum left upper eyelid: Secondary | ICD-10-CM | POA: Diagnosis not present

## 2015-09-10 DIAGNOSIS — H00014 Hordeolum externum left upper eyelid: Secondary | ICD-10-CM | POA: Diagnosis not present

## 2015-09-10 DIAGNOSIS — D2312 Other benign neoplasm of skin of left eyelid, including canthus: Secondary | ICD-10-CM | POA: Diagnosis not present

## 2015-09-12 DIAGNOSIS — G629 Polyneuropathy, unspecified: Secondary | ICD-10-CM | POA: Diagnosis not present

## 2015-09-12 DIAGNOSIS — D692 Other nonthrombocytopenic purpura: Secondary | ICD-10-CM | POA: Diagnosis not present

## 2015-09-12 DIAGNOSIS — H918X3 Other specified hearing loss, bilateral: Secondary | ICD-10-CM | POA: Diagnosis not present

## 2015-09-12 DIAGNOSIS — Z79899 Other long term (current) drug therapy: Secondary | ICD-10-CM | POA: Diagnosis not present

## 2015-09-12 DIAGNOSIS — Z1389 Encounter for screening for other disorder: Secondary | ICD-10-CM | POA: Diagnosis not present

## 2015-09-12 DIAGNOSIS — E7800 Pure hypercholesterolemia, unspecified: Secondary | ICD-10-CM | POA: Diagnosis not present

## 2015-09-12 DIAGNOSIS — I350 Nonrheumatic aortic (valve) stenosis: Secondary | ICD-10-CM | POA: Diagnosis not present

## 2015-09-12 DIAGNOSIS — K4090 Unilateral inguinal hernia, without obstruction or gangrene, not specified as recurrent: Secondary | ICD-10-CM | POA: Diagnosis not present

## 2015-09-12 DIAGNOSIS — J449 Chronic obstructive pulmonary disease, unspecified: Secondary | ICD-10-CM | POA: Diagnosis not present

## 2015-09-12 DIAGNOSIS — Z0000 Encounter for general adult medical examination without abnormal findings: Secondary | ICD-10-CM | POA: Diagnosis not present

## 2015-09-12 DIAGNOSIS — H6122 Impacted cerumen, left ear: Secondary | ICD-10-CM | POA: Diagnosis not present

## 2015-09-13 ENCOUNTER — Other Ambulatory Visit (HOSPITAL_COMMUNITY): Payer: Self-pay | Admitting: Geriatric Medicine

## 2015-09-13 DIAGNOSIS — I350 Nonrheumatic aortic (valve) stenosis: Secondary | ICD-10-CM

## 2015-09-27 ENCOUNTER — Ambulatory Visit (HOSPITAL_COMMUNITY): Payer: PPO | Attending: Cardiovascular Disease

## 2015-09-27 ENCOUNTER — Other Ambulatory Visit: Payer: Self-pay

## 2015-09-27 DIAGNOSIS — I350 Nonrheumatic aortic (valve) stenosis: Secondary | ICD-10-CM | POA: Diagnosis not present

## 2015-09-27 DIAGNOSIS — I059 Rheumatic mitral valve disease, unspecified: Secondary | ICD-10-CM | POA: Diagnosis not present

## 2015-09-27 DIAGNOSIS — Z77090 Contact with and (suspected) exposure to asbestos: Secondary | ICD-10-CM | POA: Insufficient documentation

## 2015-09-27 DIAGNOSIS — I2510 Atherosclerotic heart disease of native coronary artery without angina pectoris: Secondary | ICD-10-CM | POA: Insufficient documentation

## 2015-09-27 DIAGNOSIS — Z8249 Family history of ischemic heart disease and other diseases of the circulatory system: Secondary | ICD-10-CM | POA: Diagnosis not present

## 2015-09-27 DIAGNOSIS — J449 Chronic obstructive pulmonary disease, unspecified: Secondary | ICD-10-CM | POA: Insufficient documentation

## 2015-09-27 DIAGNOSIS — E785 Hyperlipidemia, unspecified: Secondary | ICD-10-CM | POA: Diagnosis not present

## 2015-09-27 DIAGNOSIS — Z87891 Personal history of nicotine dependence: Secondary | ICD-10-CM | POA: Diagnosis not present

## 2016-01-14 ENCOUNTER — Other Ambulatory Visit: Payer: Self-pay | Admitting: Internal Medicine

## 2016-02-21 ENCOUNTER — Telehealth: Payer: Self-pay | Admitting: Internal Medicine

## 2016-02-21 NOTE — Telephone Encounter (Signed)
Called and spoke with pt and he is aware of rx for the oxygen that has been placed in the mail to the pt per his request. Nothing further is needed.

## 2016-02-21 NOTE — Telephone Encounter (Signed)
He has been using his own concentrator at night, I think.  If he has a DME company, they can help him with travel O2 plans.  Otherwise We can give him a printed script for O2 2L/ min for sleep. He might be able to rent a light portable concentrator.    Dx COPD mixed type.

## 2016-02-21 NOTE — Telephone Encounter (Signed)
Called spoke with pt. He reports he uses a Conservation officer, nature in home. He is going to [place] on Wednesday and will be gone x 1 week. He is requesting an RX to take with him for his O2. Please advise Dr. Annamaria Boots thanks

## 2016-02-25 ENCOUNTER — Telehealth: Payer: Self-pay | Admitting: Internal Medicine

## 2016-02-25 NOTE — Telephone Encounter (Signed)
Spoke with pt and advised that rx should have went out in mail on 02/22/16 and he should receive it by tomorrow.  He will call back tomorrow if he hasnt received it.

## 2016-03-10 DIAGNOSIS — C44619 Basal cell carcinoma of skin of left upper limb, including shoulder: Secondary | ICD-10-CM | POA: Diagnosis not present

## 2016-03-10 DIAGNOSIS — D485 Neoplasm of uncertain behavior of skin: Secondary | ICD-10-CM | POA: Diagnosis not present

## 2016-03-10 DIAGNOSIS — L603 Nail dystrophy: Secondary | ICD-10-CM | POA: Diagnosis not present

## 2016-03-10 DIAGNOSIS — B359 Dermatophytosis, unspecified: Secondary | ICD-10-CM | POA: Diagnosis not present

## 2016-03-11 DIAGNOSIS — D692 Other nonthrombocytopenic purpura: Secondary | ICD-10-CM | POA: Diagnosis not present

## 2016-03-11 DIAGNOSIS — J449 Chronic obstructive pulmonary disease, unspecified: Secondary | ICD-10-CM | POA: Diagnosis not present

## 2016-03-21 DIAGNOSIS — C44619 Basal cell carcinoma of skin of left upper limb, including shoulder: Secondary | ICD-10-CM | POA: Diagnosis not present

## 2016-04-07 DIAGNOSIS — B359 Dermatophytosis, unspecified: Secondary | ICD-10-CM | POA: Diagnosis not present

## 2016-04-07 DIAGNOSIS — Z4802 Encounter for removal of sutures: Secondary | ICD-10-CM | POA: Diagnosis not present

## 2016-04-21 ENCOUNTER — Encounter: Payer: Self-pay | Admitting: Internal Medicine

## 2016-04-21 ENCOUNTER — Ambulatory Visit (INDEPENDENT_AMBULATORY_CARE_PROVIDER_SITE_OTHER): Payer: PPO | Admitting: Internal Medicine

## 2016-04-21 ENCOUNTER — Ambulatory Visit (INDEPENDENT_AMBULATORY_CARE_PROVIDER_SITE_OTHER)
Admission: RE | Admit: 2016-04-21 | Discharge: 2016-04-21 | Disposition: A | Discharge status: Home / Self Care | Payer: PPO | Source: Ambulatory Visit | Attending: Internal Medicine | Admitting: Internal Medicine

## 2016-04-21 VITALS — BP 114/72 | HR 65 | Ht 71.0 in | Wt 170.8 lb

## 2016-04-21 DIAGNOSIS — J449 Chronic obstructive pulmonary disease, unspecified: Secondary | ICD-10-CM | POA: Diagnosis not present

## 2016-04-21 DIAGNOSIS — E273 Drug-induced adrenocortical insufficiency: Secondary | ICD-10-CM | POA: Diagnosis not present

## 2016-04-21 NOTE — Assessment & Plan Note (Signed)
Remains on chronic maintenance prednisone 10 mg daily.

## 2016-04-21 NOTE — Patient Instructions (Signed)
Order- CXR  Dx chronic obstructive asthma, aortic stenosis  Sample x 2 Bevespi maintenance inhaler     Inhale 2 puffs, twice daily    Try this instead of Symbicort. When you run out, either call for prescription or go back to Symbicort.  Please call as needed

## 2016-04-21 NOTE — Assessment & Plan Note (Signed)
His complaint is more of tiredness and lack of energy rather than dyspnea. He does use rescue inhaler occasionally but without obvious respiratory exacerbation. Plan-Try Bevespi instead of Symbicort. Eye doctor to follow-up glaucoma pressure check.

## 2016-04-21 NOTE — Progress Notes (Signed)
Subjective:    Patient ID: [name] [name], male    DOB: Apr 22, 1928, 82 y.o.   MRN: [id number]  HPI M former smoker followed for chronic obstructive asthma, Allergic rhintiis,  COPD, suspected asbestos exposure after working in [employer].    02/16/15- 82 yo former smoker followed for chronic obstructive asthma/steroid dependent, Allergic rhintiis, nasal polyps,  suspected asbestos exposure after working in [employer], hx SVT/ palpitation. FOLLOWS FOR: Pt states his breathing is not well. Pt c/o increase in DOE. Pt states he has noticed a decrease in breathing when he uses his peak flow meter. Pt denies CP/tightness and cough.  No acute change or event. He just seems to feel somewhat more aware of dyspnea on exertion in recent months-possibly the heat. Has slowly lost some weight. Continues home oxygen 2 L for sleep. Walking is limited more by peripheral neuropathy than by dyspnea. 6MWT 02/16/15- 99%, 98%, 98%, 555 feet, no oxygen limitation. Walked at slow pace with an unsteady gait  07/30/2015-82 year old male former smoker followed for chronic obstructive asthma/steroid dependent, allergic rhinitis, nasal polyps, suspected asbestos exposure after working in [employer], complicated by history SVT/palpitation, moderate aortic stenosis, adrenal insufficiency O2 2L/ sleep/no DME company-he owns his concentrator Overnight oximetry 02/21/2015 did qualify for sleep O2 Maintenance prednisone 10 mg daily adrenal insufficient FOLLOWS FOR: Pt states his breathing is at baseline - DOE. Pt c/o mild dry cough. Pt denies CP/tightness, chest congestion, f/c/s. Pt states he is using his peak flow meter daily.  Chronic complaint of "no energy". Known aortic stenosis and peripheral neuropathy. CXR 02/16/2015 IMPRESSION: 1. Bilateral calcified pleural plaques consistent with scarring. 2. No acute cardiopulmonary disease identified. Electronically Signed  By: Marcello Moores Register  On: 02/16/2015  12:03  04/21/2016-82 year old male former smoker followed for chronic obstructive asthma/steroid dependent, allergic rhinitis, nasal polyps, suspected asbestos exposure after working in [employer], complicated by history SVT/palpitation, moderate aortic stenosis, adrenal insufficiency O2 2L/ sleep/no DME-he owns his concentrator. FOLLOWS FOR: Pt denies any SOB, wheezing, cough, or congestion even with his little bit of activity. His long-term complaint is "no energy" but he is been saying this as long as I have known him. Breathing has been very stable with no wheeze or cough at all. Still needs inhaler sometimes forward dyspnea with chest tightness but rare use and breathing does not wake him. Remains on chronic maintenance prednisone for adrenal insufficiency after years of therapeutic steroids.  ROS-see HPI Constitutional:   + weight loss, night sweats, fevers, chills, fatigue, lassitude. HEENT:   + headaches, difficulty swallowing, tooth/dental problems, sore throat,       No-  sneezing, itching, ear ache, +nasal congestion, post nasal drip,  CV:  No-   chest pain, orthopnea, PND, swelling in lower extremities, anasarca,  dizziness, +palpitations Resp: + shortness of breath with exertion or at rest.              No-   productive cough,  No non-productive cough,  No- coughing up of blood.              No-   change in color of mucus.  + wheezing.   Skin: No-   rash or lesions. GI:  No-   heartburn, indigestion, abdominal pain, nausea, vomiting, GU:  MS:  + joint pain or swelling.  Neuro-     +chronic paresthesias and weakness in lower extremities Psych:  No- change in mood or affect. No depression or anxiety.  No memory loss.  OBJ General-  Alert, Oriented, Affect-appropriate, Distress- none acute, + weight loss Skin- rash-none, lesions- none, excoriation- none Lymphadenopathy- none Head- atraumatic            Eyes- Gross vision intact, PERRLA, conjunctivae clear secretions             Ears- Hearing, canals-normal            Nose- + mild turbinate edema, polyps-none seen this visit                        no-Septal dev, mucus,  erosion, perforation             Throat- Mallampati II , mucosa clear , drainage- none, tonsils- atrophic Neck- flexible , trachea midline, no stridor , thyroid nl, carotid no bruit Chest - symmetrical excursion , unlabored           Heart/CV- ,+ Irregular with frequent extrasystoles, Q000111Q systolic murmur , no gallop  ,                            - JVD- none , edema- none, stasis changes- none, varices- none           Lung- +diminished but clear to P&A, wheeze- none, cough- none , dullness-none, rub- none           Chest wall-  Abd-  Br/ Gen/ Rectal- Not done, not indicated Extrem- cyanosis- none, clubbing, none, atrophy- none, strength- nl, + cane Neuro- +tremor hands

## 2016-06-17 ENCOUNTER — Other Ambulatory Visit: Payer: Self-pay | Admitting: Internal Medicine

## 2016-06-29 ENCOUNTER — Other Ambulatory Visit: Payer: Self-pay | Admitting: Internal Medicine

## 2016-07-23 ENCOUNTER — Telehealth: Payer: Self-pay | Admitting: Interventional Cardiology

## 2016-07-23 NOTE — Telephone Encounter (Signed)
Ok with me 

## 2016-07-23 NOTE — Telephone Encounter (Signed)
OK with me.

## 2016-07-23 NOTE — Telephone Encounter (Signed)
Per walk in Mrs. Ligon is requesting to transfer husband care to Dr. Cathie Olden .  I explained that you both would need to agree.

## 2016-09-10 ENCOUNTER — Other Ambulatory Visit: Payer: Self-pay | Admitting: Internal Medicine

## 2016-09-15 DIAGNOSIS — J449 Chronic obstructive pulmonary disease, unspecified: Secondary | ICD-10-CM | POA: Diagnosis not present

## 2016-09-15 DIAGNOSIS — E7800 Pure hypercholesterolemia, unspecified: Secondary | ICD-10-CM | POA: Diagnosis not present

## 2016-09-15 DIAGNOSIS — Z1389 Encounter for screening for other disorder: Secondary | ICD-10-CM | POA: Diagnosis not present

## 2016-09-15 DIAGNOSIS — J9611 Chronic respiratory failure with hypoxia: Secondary | ICD-10-CM | POA: Diagnosis not present

## 2016-09-15 DIAGNOSIS — G629 Polyneuropathy, unspecified: Secondary | ICD-10-CM | POA: Diagnosis not present

## 2016-09-15 DIAGNOSIS — Z79899 Other long term (current) drug therapy: Secondary | ICD-10-CM | POA: Diagnosis not present

## 2016-09-15 DIAGNOSIS — Z0000 Encounter for general adult medical examination without abnormal findings: Secondary | ICD-10-CM | POA: Diagnosis not present

## 2016-09-15 DIAGNOSIS — I350 Nonrheumatic aortic (valve) stenosis: Secondary | ICD-10-CM | POA: Diagnosis not present

## 2016-09-15 DIAGNOSIS — D692 Other nonthrombocytopenic purpura: Secondary | ICD-10-CM | POA: Diagnosis not present

## 2016-09-29 ENCOUNTER — Other Ambulatory Visit (HOSPITAL_COMMUNITY): Payer: PPO

## 2016-10-10 ENCOUNTER — Encounter: Payer: Self-pay | Admitting: Cardiovascular Disease

## 2016-10-15 DIAGNOSIS — M7062 Trochanteric bursitis, left hip: Secondary | ICD-10-CM | POA: Diagnosis not present

## 2016-10-20 ENCOUNTER — Other Ambulatory Visit: Payer: Self-pay | Admitting: Geriatric Medicine

## 2016-10-20 DIAGNOSIS — M545 Low back pain: Secondary | ICD-10-CM | POA: Diagnosis not present

## 2016-10-20 DIAGNOSIS — I350 Nonrheumatic aortic (valve) stenosis: Secondary | ICD-10-CM

## 2016-10-20 DIAGNOSIS — M25552 Pain in left hip: Secondary | ICD-10-CM | POA: Diagnosis not present

## 2016-10-21 ENCOUNTER — Other Ambulatory Visit: Payer: Self-pay

## 2016-10-21 ENCOUNTER — Ambulatory Visit (HOSPITAL_COMMUNITY): Payer: PPO | Attending: Cardiovascular Disease

## 2016-10-21 DIAGNOSIS — I083 Combined rheumatic disorders of mitral, aortic and tricuspid valves: Secondary | ICD-10-CM | POA: Diagnosis not present

## 2016-10-21 DIAGNOSIS — I350 Nonrheumatic aortic (valve) stenosis: Secondary | ICD-10-CM | POA: Diagnosis not present

## 2016-10-30 ENCOUNTER — Encounter: Payer: Self-pay | Admitting: Cardiovascular Disease

## 2016-10-30 ENCOUNTER — Ambulatory Visit (INDEPENDENT_AMBULATORY_CARE_PROVIDER_SITE_OTHER): Payer: PPO | Admitting: Cardiovascular Disease

## 2016-10-30 VITALS — BP 100/70 | HR 90 | Ht 71.0 in | Wt 179.0 lb

## 2016-10-30 DIAGNOSIS — I2510 Atherosclerotic heart disease of native coronary artery without angina pectoris: Secondary | ICD-10-CM | POA: Diagnosis not present

## 2016-10-30 DIAGNOSIS — I350 Nonrheumatic aortic (valve) stenosis: Secondary | ICD-10-CM

## 2016-10-30 DIAGNOSIS — I491 Atrial premature depolarization: Secondary | ICD-10-CM

## 2016-10-30 DIAGNOSIS — I272 Pulmonary hypertension, unspecified: Secondary | ICD-10-CM | POA: Diagnosis not present

## 2016-10-30 NOTE — Progress Notes (Signed)
Patient ID: [name] [name], male   DOB: 22-Aug-1927, 83 y.o.   MRN: [id number]     Patient ID: [name] [name] MRN: [id number] DOB/AGE: 22-Oct-1927 83 y.o.   Referring Physician Dr. Felipa Eth  Problem List: 1. Coronary artery disease-status post remote stenting 2. Moderate aortic stenosis 3. Premature atrial contractions 4. Point hypertension-moderate    Previous notes from Dr. Irish Lack Reason for Consultation fatigue, CAD, palpitations  HPI: 83 y/o with COPD.  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  His activity is limited from lower extremity neuropathy.  He had a stent many years ago.  He followed with Dr. Leonia Reeves until a few years ago.  I   He has had more fatigue of late. His activity is decreasing due to his shortness of breath. Because of this, he would like to have a cardiac evaluation. Has been several years since he saw Dr. Leonia Reeves. Prior to his stent, he had some decreased exertion as well. No chest pain.   Recent echo in 3/16 showed normal LV function and moderate aortic stenosis.  Neuropathy is limiting exercise.   In the morning, he feels that his HR is irregular and elevated.  He will bear down and sx get better.    Oct 30 2016:   Transfer from Dr. Irish Lack Retired from [employer]  ([place])  Has palpitations ,   No hx of syncope.   No CP or dyspnea Still does his yard work.   Went to the gym until recently .   Has some leg issues.     Current Outpatient Prescriptions  Medication Sig Dispense Refill  . albuterol (PROAIR HFA) 108 (90 BASE) MCG/ACT inhaler 2 puffs 4 times a day as needed 3 Inhaler prn  . albuterol (PROVENTIL) (2.5 MG/3ML) 0.083% nebulizer solution INHALE CONTENTS OF 1 VIAL  VIA NEBULIZATION BY MOUTH 1 OR 2 TIMES DAILY 175 mL 0  . ALPRAZolam (XANAX) 1 MG tablet Take 1 mg by mouth daily.    . budesonide-formoterol (SYMBICORT) 80-4.5 MCG/ACT inhaler Inhale 2 puffs into the lungs 2 (two) times daily.      Marland Kitchen  ipratropium (ATROVENT) 0.02 % nebulizer solution 1 VIAL VIA NEBULIZER EVERY 8 HOURS AS NEEDED FOR  WHEEZING FORSHORTNESS OF BREATH 2.5 mL 6  . ketoconazole (NIZORAL) 2 % cream Reported on 07/30/2015    . mometasone (NASONEX) 50 MCG/ACT nasal spray Place 2 sprays into the nose daily. 1-2 puffs in each nostril daily (Patient taking differently: Place 2 sprays into the nose as needed. 1-2 puffs in each nostril daily) 17 g prn  . predniSONE (DELTASONE) 5 MG tablet TAKE TWO OR THREE TABLETS BY MOUTH DAILY OR AS DIRECTED 270 tablet 1  . terazosin (HYTRIN) 5 MG capsule Once a day      No current facility-administered medications for this visit.    Past Medical History:  Diagnosis Date  . Adrenal insufficiency ([place])   . Allergic rhinitis   . Aortic stenosis, moderate   . Asthma   . Chronic sinusitis   . COPD (chronic obstructive pulmonary disease) ([place])   . H/O: asbestos exposure   . Nasal polyps   . PAC (premature atrial contraction)    EKG 12/03/09    Family History  Problem Relation Age of Onset  . Heart disease Father   . Alzheimer's disease Mother   . Heart disease Paternal Uncle     Social History   Social History  . Marital status: Married  Spouse name: N/A  . Number of children: N/A  . Years of education: N/A   Occupational History  . Armed forces training and education officer    Social History Main Topics  . Smoking status: Former Smoker    Packs/day: 1.00    Years: 20.00    Types: Cigarettes    Quit date: 06/30/1973  . Smokeless tobacco: Never Used  . Alcohol use No  . Drug use: No  . Sexual activity: Not on file   Other Topics Concern  . Not on file   Social History Narrative  . No narrative on file    Past Surgical History:  Procedure Laterality Date  . HERNIA REPAIR    . TONSILLECTOMY        (Not in a hospital admission)  Review of systems complete and found to be negative unless listed above .  No nausea, vomiting.  No fever chills, No focal weakness,  No  palpitations.  Physical Exam: Vitals:   10/30/16 1344  BP: 100/70  Pulse: 90    Weight: 179 lb (81.2 kg)  Physical exam:  [place]/AT EOMI No JVD, No carotid bruit RRR [id number] 2/6 systolic murmur  No wheezing Soft. NT, nondistended No edema. No focal motor or sensory deficits Normal affect  Labs:   No results found for: WBC No results for input(s): NA, K, CL, CO2, BUN, CREATININE, CALCIUM, PROT, BILITOT, ALKPHOS, ALT, AST, GLUCOSE in the last 168 hours.  Invalid input(s): LABALBU No results found for: CKTOTAL  No results found for: CHOL No results found for: HDL No results found for: LDLCALC No results found for: TRIG No results found for: CHOLHDL No results found for: LDLDIRECT     EKG: Oct 30, 2016:    NSR with PACs and sinus arrhythmias.    ASSESSMENT AND PLAN:  1.  Coronary artery disease: Prior stent many years ago. He has not had an evaluation for ischemia in several years. Most recent nuclear stress test in 2011 was negative for ischemia and he had normal LV function.  Has not had any symptoms.  2. Moderate Aortic stenosis  3. Pulmonary HTN:    PACs: He is not symptomatic. No evidence of atrial fibrillation.     Mertie Moores, MD  10/30/2016 2:26 PM    [facility] [place],  [address] [place], [place]  [address] Pager [phone] Phone: [phone]; Fax: [phone]

## 2016-10-30 NOTE — Patient Instructions (Signed)

## 2016-11-29 ENCOUNTER — Other Ambulatory Visit: Payer: Self-pay | Admitting: Internal Medicine

## 2016-12-02 ENCOUNTER — Other Ambulatory Visit: Payer: Self-pay | Admitting: Orthopedic Surgery

## 2016-12-02 DIAGNOSIS — M25552 Pain in left hip: Secondary | ICD-10-CM | POA: Diagnosis not present

## 2016-12-02 DIAGNOSIS — M545 Low back pain: Secondary | ICD-10-CM

## 2016-12-16 ENCOUNTER — Ambulatory Visit
Admission: RE | Admit: 2016-12-16 | Discharge: 2016-12-16 | Disposition: A | Discharge status: Home / Self Care | Payer: PPO | Source: Ambulatory Visit | Attending: Orthopedic Surgery | Admitting: Orthopedic Surgery

## 2016-12-16 DIAGNOSIS — M545 Low back pain: Secondary | ICD-10-CM

## 2016-12-16 DIAGNOSIS — M5126 Other intervertebral disc displacement, lumbar region: Secondary | ICD-10-CM | POA: Diagnosis not present

## 2016-12-16 DIAGNOSIS — M48061 Spinal stenosis, lumbar region without neurogenic claudication: Secondary | ICD-10-CM | POA: Diagnosis not present

## 2016-12-23 DIAGNOSIS — M25552 Pain in left hip: Secondary | ICD-10-CM | POA: Diagnosis not present

## 2017-01-08 DIAGNOSIS — M545 Low back pain: Secondary | ICD-10-CM | POA: Diagnosis not present

## 2017-01-08 DIAGNOSIS — M48061 Spinal stenosis, lumbar region without neurogenic claudication: Secondary | ICD-10-CM | POA: Diagnosis not present

## 2017-01-08 DIAGNOSIS — M47816 Spondylosis without myelopathy or radiculopathy, lumbar region: Secondary | ICD-10-CM | POA: Diagnosis not present

## 2017-03-18 DIAGNOSIS — J9611 Chronic respiratory failure with hypoxia: Secondary | ICD-10-CM | POA: Diagnosis not present

## 2017-03-18 DIAGNOSIS — L299 Pruritus, unspecified: Secondary | ICD-10-CM | POA: Diagnosis not present

## 2017-03-18 DIAGNOSIS — I272 Pulmonary hypertension, unspecified: Secondary | ICD-10-CM | POA: Diagnosis not present

## 2017-03-18 DIAGNOSIS — G8929 Other chronic pain: Secondary | ICD-10-CM | POA: Diagnosis not present

## 2017-03-18 DIAGNOSIS — Z23 Encounter for immunization: Secondary | ICD-10-CM | POA: Diagnosis not present

## 2017-03-18 DIAGNOSIS — I350 Nonrheumatic aortic (valve) stenosis: Secondary | ICD-10-CM | POA: Diagnosis not present

## 2017-03-18 DIAGNOSIS — M5441 Lumbago with sciatica, right side: Secondary | ICD-10-CM | POA: Diagnosis not present

## 2017-03-18 DIAGNOSIS — J449 Chronic obstructive pulmonary disease, unspecified: Secondary | ICD-10-CM | POA: Diagnosis not present

## 2017-03-18 DIAGNOSIS — G629 Polyneuropathy, unspecified: Secondary | ICD-10-CM | POA: Diagnosis not present

## 2017-04-30 DIAGNOSIS — M47816 Spondylosis without myelopathy or radiculopathy, lumbar region: Secondary | ICD-10-CM | POA: Diagnosis not present

## 2017-04-30 DIAGNOSIS — M5416 Radiculopathy, lumbar region: Secondary | ICD-10-CM | POA: Diagnosis not present

## 2017-04-30 DIAGNOSIS — M545 Low back pain: Secondary | ICD-10-CM | POA: Diagnosis not present

## 2017-04-30 DIAGNOSIS — M48061 Spinal stenosis, lumbar region without neurogenic claudication: Secondary | ICD-10-CM | POA: Diagnosis not present

## 2017-05-19 DIAGNOSIS — C4441 Basal cell carcinoma of skin of scalp and neck: Secondary | ICD-10-CM | POA: Diagnosis not present

## 2017-05-19 DIAGNOSIS — C44319 Basal cell carcinoma of skin of other parts of face: Secondary | ICD-10-CM | POA: Diagnosis not present

## 2017-05-19 DIAGNOSIS — L821 Other seborrheic keratosis: Secondary | ICD-10-CM | POA: Diagnosis not present

## 2017-05-19 DIAGNOSIS — B353 Tinea pedis: Secondary | ICD-10-CM | POA: Diagnosis not present

## 2017-05-19 DIAGNOSIS — L309 Dermatitis, unspecified: Secondary | ICD-10-CM | POA: Diagnosis not present

## 2017-05-19 DIAGNOSIS — L814 Other melanin hyperpigmentation: Secondary | ICD-10-CM | POA: Diagnosis not present

## 2017-05-19 DIAGNOSIS — C44519 Basal cell carcinoma of skin of other part of trunk: Secondary | ICD-10-CM | POA: Diagnosis not present

## 2017-06-02 DIAGNOSIS — M47816 Spondylosis without myelopathy or radiculopathy, lumbar region: Secondary | ICD-10-CM | POA: Diagnosis not present

## 2017-06-02 DIAGNOSIS — M48061 Spinal stenosis, lumbar region without neurogenic claudication: Secondary | ICD-10-CM | POA: Diagnosis not present

## 2017-06-02 DIAGNOSIS — M545 Low back pain: Secondary | ICD-10-CM | POA: Diagnosis not present

## 2017-06-02 DIAGNOSIS — M7061 Trochanteric bursitis, right hip: Secondary | ICD-10-CM | POA: Diagnosis not present

## 2017-07-01 DIAGNOSIS — Z85828 Personal history of other malignant neoplasm of skin: Secondary | ICD-10-CM | POA: Diagnosis not present

## 2017-07-01 DIAGNOSIS — C44319 Basal cell carcinoma of skin of other parts of face: Secondary | ICD-10-CM | POA: Diagnosis not present

## 2017-07-08 DIAGNOSIS — C44519 Basal cell carcinoma of skin of other part of trunk: Secondary | ICD-10-CM | POA: Diagnosis not present

## 2017-07-08 DIAGNOSIS — L820 Inflamed seborrheic keratosis: Secondary | ICD-10-CM | POA: Diagnosis not present

## 2017-07-08 DIAGNOSIS — L821 Other seborrheic keratosis: Secondary | ICD-10-CM | POA: Diagnosis not present

## 2017-07-08 DIAGNOSIS — Z85828 Personal history of other malignant neoplasm of skin: Secondary | ICD-10-CM | POA: Diagnosis not present

## 2017-07-14 DIAGNOSIS — M25551 Pain in right hip: Secondary | ICD-10-CM | POA: Diagnosis not present

## 2017-07-14 DIAGNOSIS — J449 Chronic obstructive pulmonary disease, unspecified: Secondary | ICD-10-CM | POA: Diagnosis not present

## 2017-08-03 DIAGNOSIS — M5416 Radiculopathy, lumbar region: Secondary | ICD-10-CM | POA: Diagnosis not present

## 2017-08-03 DIAGNOSIS — M25561 Pain in right knee: Secondary | ICD-10-CM | POA: Diagnosis not present

## 2017-08-07 DIAGNOSIS — M25551 Pain in right hip: Secondary | ICD-10-CM | POA: Diagnosis not present

## 2017-08-10 DIAGNOSIS — M25551 Pain in right hip: Secondary | ICD-10-CM | POA: Diagnosis not present

## 2017-08-12 DIAGNOSIS — M25551 Pain in right hip: Secondary | ICD-10-CM | POA: Diagnosis not present

## 2017-08-17 DIAGNOSIS — M25551 Pain in right hip: Secondary | ICD-10-CM | POA: Diagnosis not present

## 2017-09-18 ENCOUNTER — Other Ambulatory Visit: Payer: Self-pay | Admitting: Geriatric Medicine

## 2017-09-18 DIAGNOSIS — M48061 Spinal stenosis, lumbar region without neurogenic claudication: Secondary | ICD-10-CM | POA: Diagnosis not present

## 2017-09-18 DIAGNOSIS — M79604 Pain in right leg: Secondary | ICD-10-CM | POA: Diagnosis not present

## 2017-09-18 DIAGNOSIS — G629 Polyneuropathy, unspecified: Secondary | ICD-10-CM | POA: Diagnosis not present

## 2017-09-18 DIAGNOSIS — E7800 Pure hypercholesterolemia, unspecified: Secondary | ICD-10-CM | POA: Diagnosis not present

## 2017-09-18 DIAGNOSIS — Z79899 Other long term (current) drug therapy: Secondary | ICD-10-CM | POA: Diagnosis not present

## 2017-09-18 DIAGNOSIS — I272 Pulmonary hypertension, unspecified: Secondary | ICD-10-CM | POA: Diagnosis not present

## 2017-09-18 DIAGNOSIS — I350 Nonrheumatic aortic (valve) stenosis: Secondary | ICD-10-CM

## 2017-09-18 DIAGNOSIS — Z1389 Encounter for screening for other disorder: Secondary | ICD-10-CM | POA: Diagnosis not present

## 2017-09-18 DIAGNOSIS — J449 Chronic obstructive pulmonary disease, unspecified: Secondary | ICD-10-CM | POA: Diagnosis not present

## 2017-09-18 DIAGNOSIS — Z0000 Encounter for general adult medical examination without abnormal findings: Secondary | ICD-10-CM | POA: Diagnosis not present

## 2017-09-18 DIAGNOSIS — J9611 Chronic respiratory failure with hypoxia: Secondary | ICD-10-CM | POA: Diagnosis not present

## 2017-09-25 ENCOUNTER — Other Ambulatory Visit (HOSPITAL_COMMUNITY): Payer: PPO

## 2017-10-08 ENCOUNTER — Encounter: Payer: Self-pay | Admitting: Internal Medicine

## 2017-10-08 ENCOUNTER — Ambulatory Visit: Payer: PPO | Admitting: Internal Medicine

## 2017-10-08 DIAGNOSIS — E273 Drug-induced adrenocortical insufficiency: Secondary | ICD-10-CM | POA: Diagnosis not present

## 2017-10-08 DIAGNOSIS — J449 Chronic obstructive pulmonary disease, unspecified: Secondary | ICD-10-CM

## 2017-10-08 MED ORDER — PREDNISONE 5 MG PO TABS: ORAL_TABLET | ORAL | 2 refills | Status: DC

## 2017-10-08 NOTE — Progress Notes (Signed)
Subjective:    Patient ID: [name] [name], male    DOB: 1928-03-04, 84 y.o.   MRN: [id number]  HPI M former smoker followed for chronic obstructive asthma, Allergic rhintiis,  COPD, suspected asbestos exposure after working in [employer]. CAD, PHTN 6MWT 02/16/15- 99%, 98%, 98%, 555 feet, no oxygen limitation. Walked at slow pace with an unsteady gait Overnight oximetry 02/21/2015 did qualify for sleep O2 EchoCardiogram 10/21/16-moderate pulmonary hypertension  (43 S) ---------------------------------------------------------------------------------------  04/21/2016-84 year old male former smoker followed for chronic obstructive asthma/steroid dependent, allergic rhinitis, nasal polyps, suspected asbestos exposure after working in [employer], complicated by history SVT/palpitation, moderate aortic stenosis, , adrenal insufficiency O2 2L/ sleep/no DME-he owns his concentrator. FOLLOWS FOR: Pt denies any SOB, wheezing, cough, or congestion even with his little bit of activity. His long-term complaint is "no energy" but he is been saying this as long as I have known him. Breathing has been very stable with no wheeze or cough at all. Still needs inhaler sometimes forward dyspnea with chest tightness but rare use and breathing does not wake him. Remains on chronic maintenance prednisone for adrenal insufficiency after years of therapeutic steroids.  10/08/17- 84 year old male former smoker followed for chronic obstructive asthma/steroid dependent, allergic rhinitis, nasal polyps, suspected asbestos exposure after working in [employer], complicated by history SVT/palpitation/ CAD, moderate aortic stenosis, PHTN adrenal insufficiency O2 2L/ sleep/no DME-he owns his concentrator. ----COPD: Pt states his breathing is about the same over the years; denies any wheezing, SOB with exertion (limits his movement), or cough. Limited by legs and back pain, uses rolling walker.  No longer active enough to  be aware of dyspnea on exertion; little cough or wheeze.  He follows his peak flow meter noting that rescue inhaler can raise peak flow from 250-300.  [name] remains on chronic maintenance adrenal replacement prednisone 5 mg x2 tabs daily. CXR 04/21/16 IMPRESSION: Stable calcified bilateral pleural plaques. Stable elevation of the right hemidiaphragm. No active disease. EchoCardiogram 10/21/16-moderate pulmonary hypertension  (43 S)  ROS-see HPI   + = positive Constitutional:   + weight loss, night sweats, fevers, chills, fatigue, lassitude. HEENT:   + headaches, difficulty swallowing, tooth/dental problems, sore throat,       No-  sneezing, itching, ear ache, +nasal congestion, post nasal drip,  CV:  No-   chest pain, orthopnea, PND, swelling in lower extremities, anasarca,  dizziness, +palpitations Resp: + shortness of breath with exertion or at rest.              No-   productive cough,  No non-productive cough,  No- coughing up of blood.              No-   change in color of mucus.  + wheezing.   Skin: No-   rash or lesions. GI:  No-   heartburn, indigestion, abdominal pain, nausea, vomiting, GU:  MS:  + joint pain or swelling.  Neuro-     +chronic paresthesias and weakness in lower extremities Psych:  No- change in mood or affect. No depression or anxiety.  No memory loss.  OBJ General- Alert, Oriented, Affect-appropriate, Distress- none acute, + weight loss Skin- rash-none, lesions- none, excoriation- none Lymphadenopathy- none Head- atraumatic            Eyes- Gross vision intact, PERRLA, conjunctivae clear secretions            Ears- Hearing, canals-normal            Nose- + mild turbinate  edema, polyps-none seen this visit                        no-Septal dev, mucus,  erosion, perforation             Throat- Mallampati II , mucosa clear , drainage- none, tonsils- atrophic Neck- flexible , trachea midline, no stridor , thyroid nl, carotid no bruit Chest - symmetrical  excursion , unlabored           Heart/CV- ,+ Irregular with frequent extrasystoles, +4/1 systolic murmur , no gallop  ,                            - JVD- none , edema- none, stasis changes- none, varices- none           Lung- +diminished but clear to P&A, wheeze- none, cough- none , dullness-none, rub- none           Chest wall-  Abd-  Br/ Gen/ Rectal- Not done, not indicated Extrem- cyanosis- none, clubbing, none, atrophy- none, strength- nl, + cane Neuro- +tremor hands

## 2017-10-08 NOTE — Patient Instructions (Signed)
Really glad that you are doing so well with your breathing,  Refill script sent for prednisone  Ok to continue the inhaled meds  Please call if we can help

## 2017-10-08 NOTE — Assessment & Plan Note (Signed)
He maintains prednisone 10 mg daily-refilled today

## 2017-10-08 NOTE — Assessment & Plan Note (Signed)
Clinically stable.  Dyspnea on exertion would have pulmonary, cardiac valvular, and pulmonary hypertension components. He is satisfied to pay attention to his peak flow meter and to use his inhaled medications without change.

## 2017-11-06 DIAGNOSIS — M5416 Radiculopathy, lumbar region: Secondary | ICD-10-CM | POA: Diagnosis not present

## 2017-11-09 ENCOUNTER — Other Ambulatory Visit: Payer: Self-pay | Admitting: Orthopedic Surgery

## 2017-11-09 DIAGNOSIS — M5416 Radiculopathy, lumbar region: Secondary | ICD-10-CM

## 2017-11-17 ENCOUNTER — Ambulatory Visit
Admission: RE | Admit: 2017-11-17 | Discharge: 2017-11-17 | Disposition: A | Discharge status: Home / Self Care | Payer: PPO | Source: Ambulatory Visit | Attending: Orthopedic Surgery | Admitting: Orthopedic Surgery

## 2017-11-17 DIAGNOSIS — M5416 Radiculopathy, lumbar region: Secondary | ICD-10-CM

## 2017-11-17 DIAGNOSIS — M47817 Spondylosis without myelopathy or radiculopathy, lumbosacral region: Secondary | ICD-10-CM | POA: Diagnosis not present

## 2017-11-17 MED ORDER — IOPAMIDOL (ISOVUE-M 200) INJECTION 41%
1.0000 mL | Freq: Once | INTRAMUSCULAR | Status: AC
  Administered 2017-11-17: 1 mL via EPIDURAL

## 2017-11-17 MED ORDER — METHYLPREDNISOLONE ACETATE 40 MG/ML INJ SUSP (RADIOLOG
120.0000 mg | Freq: Once | INTRAMUSCULAR | Status: AC
  Administered 2017-11-17: 120 mg via EPIDURAL

## 2017-11-17 NOTE — Discharge Instructions (Signed)

## 2017-11-30 ENCOUNTER — Telehealth: Payer: Self-pay | Admitting: Internal Medicine

## 2017-11-30 MED ORDER — DOXYCYCLINE HYCLATE 100 MG PO TABS: ORAL_TABLET | ORAL | 0 refills | Status: DC

## 2017-11-30 NOTE — Telephone Encounter (Signed)
Spoke with pt, he states he may have bronchitis. He took Mucinex and thinks he may need an antibiotic. He is coughing up yellow mucus. He denies fever and SOB. He is getting worse by the days and wants to prevent this from getting to bad causing an exacerbation. CY please advise.   [place]   Current Outpatient Medications on File Prior to Visit  Medication Sig Dispense Refill  . albuterol (PROAIR HFA) 108 (90 BASE) MCG/ACT inhaler 2 puffs 4 times a day as needed (Patient not taking: Reported on 10/08/2017) 3 Inhaler prn  . albuterol (PROVENTIL) (2.5 MG/3ML) 0.083% nebulizer solution USE ONE VIAL VIA NEBULIZATION BY MOUTH  DAILY OR TWICE A DAY 25 vial 5  . ALPRAZolam (XANAX) 1 MG tablet Take 1 mg by mouth daily.    . budesonide-formoterol (SYMBICORT) 80-4.5 MCG/ACT inhaler Inhale 2 puffs into the lungs 2 (two) times daily.      Marland Kitchen ipratropium (ATROVENT) 0.02 % nebulizer solution 1 VIAL VIA NEBULIZER EVERY 8 HOURS AS NEEDED FOR  WHEEZING FORSHORTNESS OF BREATH 2.5 mL 6  . ketoconazole (NIZORAL) 2 % cream Reported on 07/30/2015    . mometasone (NASONEX) 50 MCG/ACT nasal spray Place 2 sprays into the nose daily. 1-2 puffs in each nostril daily (Patient taking differently: Place 2 sprays into the nose as needed. 1-2 puffs in each nostril daily) 17 g prn  . predniSONE (DELTASONE) 5 MG tablet TAKE TWO OR THREE TABLETS BY MOUTH DAILY OR AS DIRECTED 270 tablet 2  . terazosin (HYTRIN) 5 MG capsule Once a day      No current facility-administered medications on file prior to visit.    Allergies  Allergen Reactions  . Fenofibrate     Other reaction(s): pancreatitis  . Ipratropium     Other reaction(s): bph symptoms  . Zocor  [Simvastatin]     Other reaction(s): muscle weakness

## 2017-11-30 NOTE — Telephone Encounter (Signed)
Offer doxycycline 100 mg, # 8, 2 today then one daily 

## 2017-11-30 NOTE — Telephone Encounter (Signed)
Called pt and advised message from the provider. Pt understood and verbalized understanding. Nothing further is needed.   Rx sent to the pharmacy.  

## 2017-12-07 DIAGNOSIS — M5416 Radiculopathy, lumbar region: Secondary | ICD-10-CM | POA: Diagnosis not present

## 2017-12-09 ENCOUNTER — Other Ambulatory Visit (HOSPITAL_COMMUNITY): Payer: PPO

## 2017-12-22 DIAGNOSIS — M545 Low back pain: Secondary | ICD-10-CM | POA: Diagnosis not present

## 2017-12-29 DIAGNOSIS — M5416 Radiculopathy, lumbar region: Secondary | ICD-10-CM | POA: Diagnosis not present

## 2017-12-29 DIAGNOSIS — G629 Polyneuropathy, unspecified: Secondary | ICD-10-CM | POA: Diagnosis not present

## 2018-01-07 DIAGNOSIS — M5416 Radiculopathy, lumbar region: Secondary | ICD-10-CM | POA: Diagnosis not present

## 2018-01-13 DIAGNOSIS — Z85828 Personal history of other malignant neoplasm of skin: Secondary | ICD-10-CM | POA: Diagnosis not present

## 2018-01-13 DIAGNOSIS — C44319 Basal cell carcinoma of skin of other parts of face: Secondary | ICD-10-CM | POA: Diagnosis not present

## 2018-01-13 DIAGNOSIS — L821 Other seborrheic keratosis: Secondary | ICD-10-CM | POA: Diagnosis not present

## 2018-01-13 DIAGNOSIS — C4441 Basal cell carcinoma of skin of scalp and neck: Secondary | ICD-10-CM | POA: Diagnosis not present

## 2018-01-13 DIAGNOSIS — D225 Melanocytic nevi of trunk: Secondary | ICD-10-CM | POA: Diagnosis not present

## 2018-01-13 DIAGNOSIS — C44311 Basal cell carcinoma of skin of nose: Secondary | ICD-10-CM | POA: Diagnosis not present

## 2018-01-13 DIAGNOSIS — L814 Other melanin hyperpigmentation: Secondary | ICD-10-CM | POA: Diagnosis not present

## 2018-01-22 DIAGNOSIS — I350 Nonrheumatic aortic (valve) stenosis: Secondary | ICD-10-CM | POA: Diagnosis not present

## 2018-01-22 DIAGNOSIS — J449 Chronic obstructive pulmonary disease, unspecified: Secondary | ICD-10-CM | POA: Diagnosis not present

## 2018-01-22 DIAGNOSIS — M5441 Lumbago with sciatica, right side: Secondary | ICD-10-CM | POA: Diagnosis not present

## 2018-01-22 DIAGNOSIS — I2510 Atherosclerotic heart disease of native coronary artery without angina pectoris: Secondary | ICD-10-CM | POA: Diagnosis not present

## 2018-01-28 ENCOUNTER — Other Ambulatory Visit (HOSPITAL_COMMUNITY): Payer: Self-pay | Admitting: Geriatric Medicine

## 2018-01-28 DIAGNOSIS — I350 Nonrheumatic aortic (valve) stenosis: Secondary | ICD-10-CM

## 2018-02-01 ENCOUNTER — Other Ambulatory Visit: Payer: Self-pay

## 2018-02-01 ENCOUNTER — Ambulatory Visit (HOSPITAL_COMMUNITY): Payer: PPO | Attending: Cardiology

## 2018-02-01 DIAGNOSIS — I081 Rheumatic disorders of both mitral and tricuspid valves: Secondary | ICD-10-CM | POA: Insufficient documentation

## 2018-02-01 DIAGNOSIS — I272 Pulmonary hypertension, unspecified: Secondary | ICD-10-CM | POA: Insufficient documentation

## 2018-02-01 DIAGNOSIS — I350 Nonrheumatic aortic (valve) stenosis: Secondary | ICD-10-CM | POA: Diagnosis not present

## 2018-02-01 DIAGNOSIS — J439 Emphysema, unspecified: Secondary | ICD-10-CM | POA: Insufficient documentation

## 2018-02-18 ENCOUNTER — Encounter: Payer: Self-pay | Admitting: Cardiovascular Disease

## 2018-02-23 DIAGNOSIS — M25521 Pain in right elbow: Secondary | ICD-10-CM | POA: Diagnosis not present

## 2018-03-15 ENCOUNTER — Encounter

## 2018-03-15 ENCOUNTER — Ambulatory Visit (INDEPENDENT_AMBULATORY_CARE_PROVIDER_SITE_OTHER): Payer: PPO | Admitting: Cardiovascular Disease

## 2018-03-15 ENCOUNTER — Encounter: Payer: Self-pay | Admitting: Cardiovascular Disease

## 2018-03-15 VITALS — BP 90/58 | HR 84 | Ht 71.0 in | Wt 175.1 lb

## 2018-03-15 DIAGNOSIS — I350 Nonrheumatic aortic (valve) stenosis: Secondary | ICD-10-CM | POA: Diagnosis not present

## 2018-03-15 NOTE — Patient Instructions (Signed)

## 2018-03-15 NOTE — Progress Notes (Signed)
Patient ID: [name] [name], male   DOB: 1928-06-14, 84 y.o.   MRN: [id number]     Patient ID: [name] [name] MRN: [id number] DOB/AGE: 1928/05/17 84 y.o.   Referring Physician Dr. Felipa Eth  Problem List: 1. Coronary artery disease-status post remote stenting 2. Moderate aortic stenosis 3. Premature atrial contractions 4. Point hypertension-moderate    Previous notes from Dr. Irish Lack Reason for Consultation fatigue, CAD, palpitations  HPI: 84 y/o with COPD.  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  His activity is limited from lower extremity neuropathy.  He had a stent many years ago.  He followed with Dr. Leonia Reeves until a few years ago.  I   He has had more fatigue of late. His activity is decreasing due to his shortness of breath. Because of this, he would like to have a cardiac evaluation. Has been several years since he saw Dr. Leonia Reeves. Prior to his stent, he had some decreased exertion as well. No chest pain.   Recent echo in 3/16 showed normal LV function and moderate aortic stenosis.  Neuropathy is limiting exercise.   In the morning, he feels that his HR is irregular and elevated.  He will bear down and sx get better.    Oct 30 2016:   Transfer from Dr. Irish Lack Retired from [employer]  ([place])  Has palpitations ,   No hx of syncope.   No CP or dyspnea Still does his yard work.   Went to the gym until recently .   Has some leg issues.   March 15, 2018: [name] is seen back today  Has COPD - is not more short of breath compared to usual He had an echocardiogram in August that revealed that his aortic valve gradient had now increased to a mean of 40.  This is up from last year which revealed a mean aortic valve gradient of 30 mmHg.  He denies any syncope or chest pain.  He has some baseline shortness of breath because of his COPD but is no more short of breath any used to be.  Still fairly active. Has spinal stenosis .      Current Outpatient Medications  Medication Sig Dispense Refill  . albuterol (PROAIR HFA) 108 (90 BASE) MCG/ACT inhaler 2 puffs 4 times a day as needed 3 Inhaler prn  . albuterol (PROVENTIL) (2.5 MG/3ML) 0.083% nebulizer solution USE ONE VIAL VIA NEBULIZATION BY MOUTH  DAILY OR TWICE A DAY 25 vial 5  . ALPRAZolam (XANAX) 1 MG tablet Take 1 mg by mouth daily.    . budesonide-formoterol (SYMBICORT) 80-4.5 MCG/ACT inhaler Inhale 2 puffs into the lungs 2 (two) times daily.      Marland Kitchen ipratropium (ATROVENT) 0.02 % nebulizer solution 1 VIAL VIA NEBULIZER EVERY 8 HOURS AS NEEDED FOR  WHEEZING FORSHORTNESS OF BREATH 2.5 mL 6  . ketoconazole (NIZORAL) 2 % cream Reported on 07/30/2015    . LYRICA 75 MG capsule Take 1 capsule by mouth 2 (two) times daily.    . mometasone (NASONEX) 50 MCG/ACT nasal spray Place 2 sprays into the nose daily. 1-2 puffs in each nostril daily 17 g prn  . predniSONE (DELTASONE) 5 MG tablet TAKE TWO OR THREE TABLETS BY MOUTH DAILY OR AS DIRECTED 270 tablet 2  . terazosin (HYTRIN) 5 MG capsule Take 5 mg by mouth daily. Once a day      No current facility-administered medications for this visit.    Past Medical History:  Diagnosis  Date  . Adrenal insufficiency ([place])   . Allergic rhinitis   . Aortic stenosis, moderate   . Asthma   . Chronic sinusitis   . COPD (chronic obstructive pulmonary disease) ([place])   . H/O: asbestos exposure   . Nasal polyps   . PAC (premature atrial contraction)    EKG 12/03/09    Family History  Problem Relation Age of Onset  . Heart disease Father   . Alzheimer's disease Mother   . Heart disease Paternal Uncle     Social History   Socioeconomic History  . Marital status: Married    Spouse name: Not on file  . Number of children: Not on file  . Years of education: Not on file  . Highest education level: Not on file  Occupational History  . Occupation: Armed forces training and education officer  Social Needs  . Financial resource strain: Not on file  . Food  insecurity:    Worry: Not on file    Inability: Not on file  . Transportation needs:    Medical: Not on file    Non-medical: Not on file  Tobacco Use  . Smoking status: Former Smoker    Packs/day: 1.00    Years: 20.00    Pack years: 20.00    Types: Cigarettes    Last attempt to quit: 06/30/1973    Years since quitting: 44.7  . Smokeless tobacco: Never Used  Substance and Sexual Activity  . Alcohol use: No    Alcohol/week: 0.0 standard drinks  . Drug use: No  . Sexual activity: Not on file  Lifestyle  . Physical activity:    Days per week: Not on file    Minutes per session: Not on file  . Stress: Not on file  Relationships  . Social connections:    Talks on phone: Not on file    Gets together: Not on file    Attends religious service: Not on file    Active member of club or organization: Not on file    Attends meetings of clubs or organizations: Not on file    Relationship status: Not on file  . Intimate partner violence:    Fear of current or ex partner: Not on file    Emotionally abused: Not on file    Physically abused: Not on file    Forced sexual activity: Not on file  Other Topics Concern  . Not on file  Social History Narrative  . Not on file    Past Surgical History:  Procedure Laterality Date  . HERNIA REPAIR    . TONSILLECTOMY        (Not in a hospital admission)  Review of systems complete and found to be negative unless listed above .  No nausea, vomiting.  No fever chills, No focal weakness,  No palpitations.  Physical Exam: Blood pressure (!) 90/58, pulse 84, height 5\' 11"  (1.803 m), weight 175 lb 1.9 oz (79.4 kg), SpO2 91 %.  GEN:  Well nourished, well developed in no acute distress HEENT: Normal NECK: No JVD; No carotid bruits LYMPHATICS: No lymphadenopathy CARDIAC: RR  0-2/7 systolic murmur  RESPIRATORY:  Clear to auscultation without rales, wheezing or rhonchi  ABDOMEN: Soft, non-tender, non-distended MUSCULOSKELETAL:  No edema; No  deformity  SKIN: Warm and dry NEUROLOGIC:  Alert and oriented x 3   Labs:     EKG:    Sept. 16, 2019 >   NSR at 84.  Sinus arrhythmia    ASSESSMENT AND PLAN:  1.  Coronary artery disease:   2.  Aortic stenosis -his most recent echocardiogram suggested that his aortic stenosis was more on the severe side.  Personally reviewed the echo and I am not sure that the actual mean aortic valve gradient is 40.  It seems to be more in the mid 30 range.  Symptoms have not changed since last year.  Back in for follow-up visit in 6 months.  If he develops worsening shortness of breath, chest pain, or syncope.  He is to call us right away.  3. Pulmonary HTN:    PACs: He is not symptomatic. No evidence of atrial fibrillation.     Mertie Moores, MD  03/15/2018 1:46 PM    [facility],  [address] [place], [place]  [address] Pager [phone] Phone: [phone]; Fax: [phone]

## 2018-03-17 DIAGNOSIS — Z85828 Personal history of other malignant neoplasm of skin: Secondary | ICD-10-CM | POA: Diagnosis not present

## 2018-03-17 DIAGNOSIS — C44321 Squamous cell carcinoma of skin of nose: Secondary | ICD-10-CM | POA: Diagnosis not present

## 2018-04-05 DIAGNOSIS — M5416 Radiculopathy, lumbar region: Secondary | ICD-10-CM | POA: Diagnosis not present

## 2018-04-28 IMAGING — DX DG CHEST 2V
2 series · 2 of 2 positions shown · non-contrast
Comparison: 02/16/2015

CLINICAL DATA: COPD

EXAM:
CHEST  2 VIEW

[chest pa]
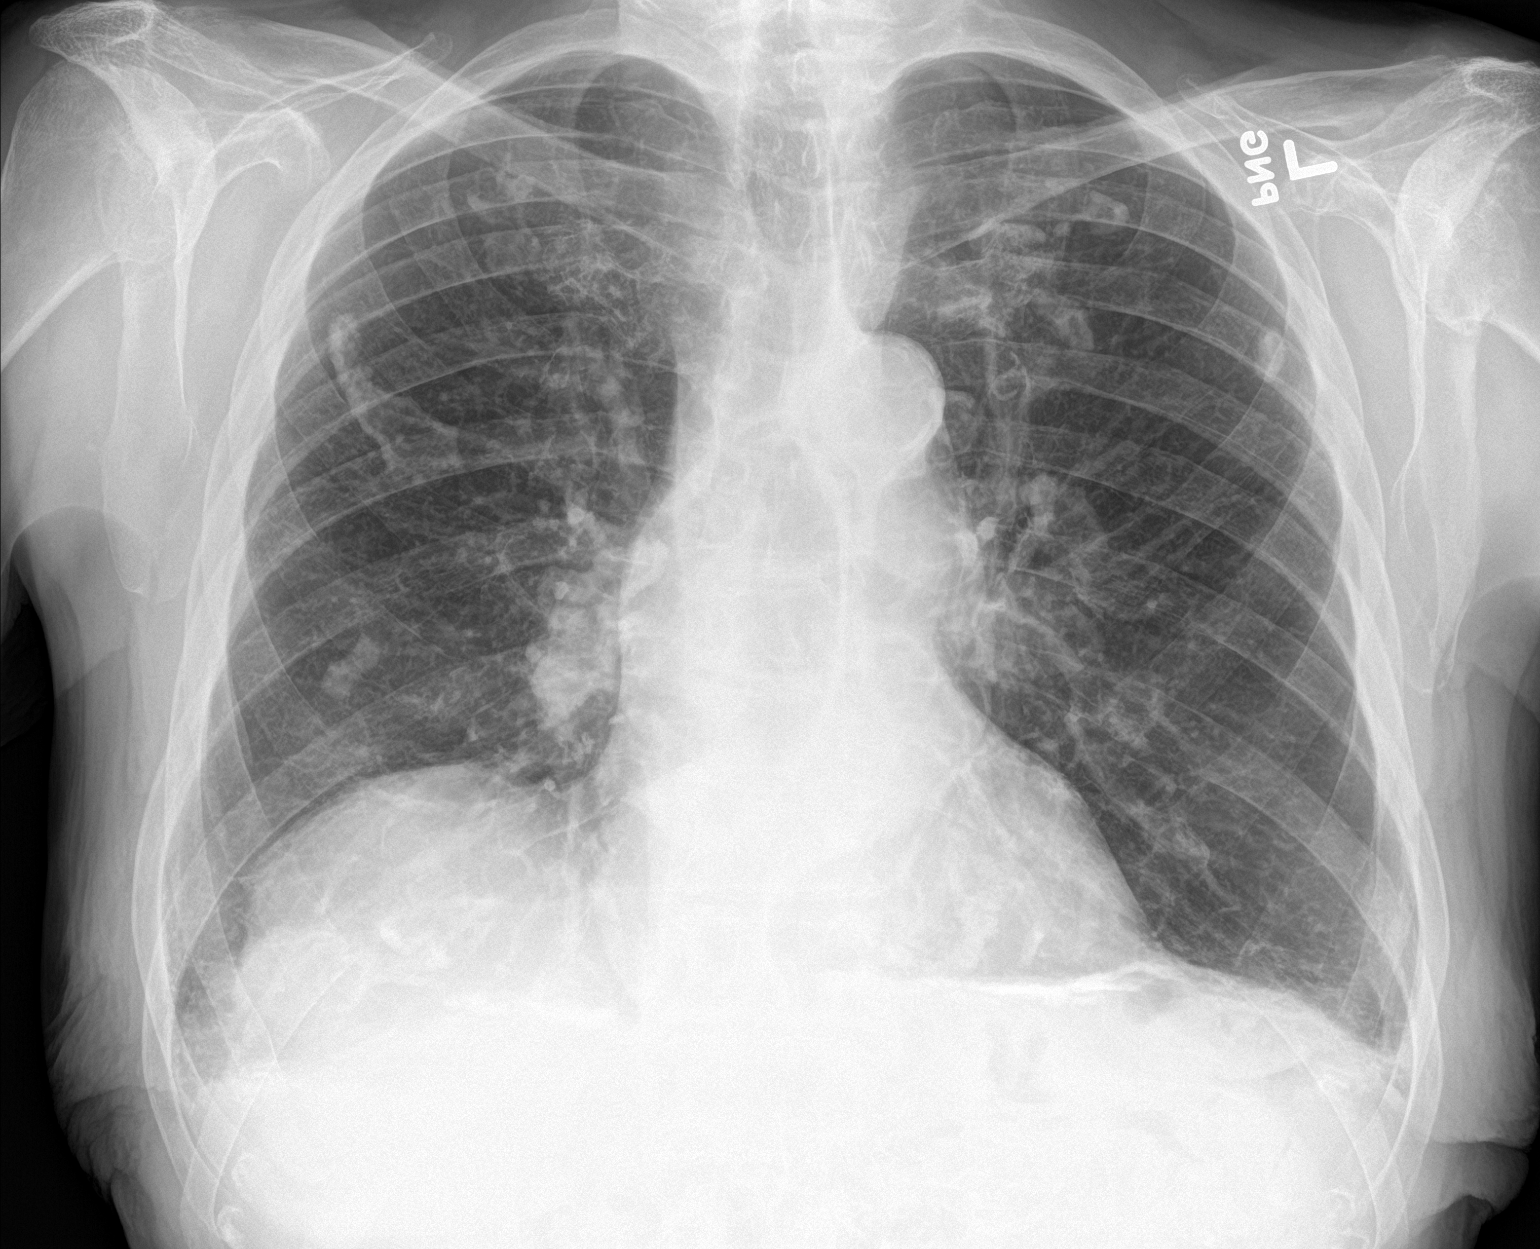

[chest lat]
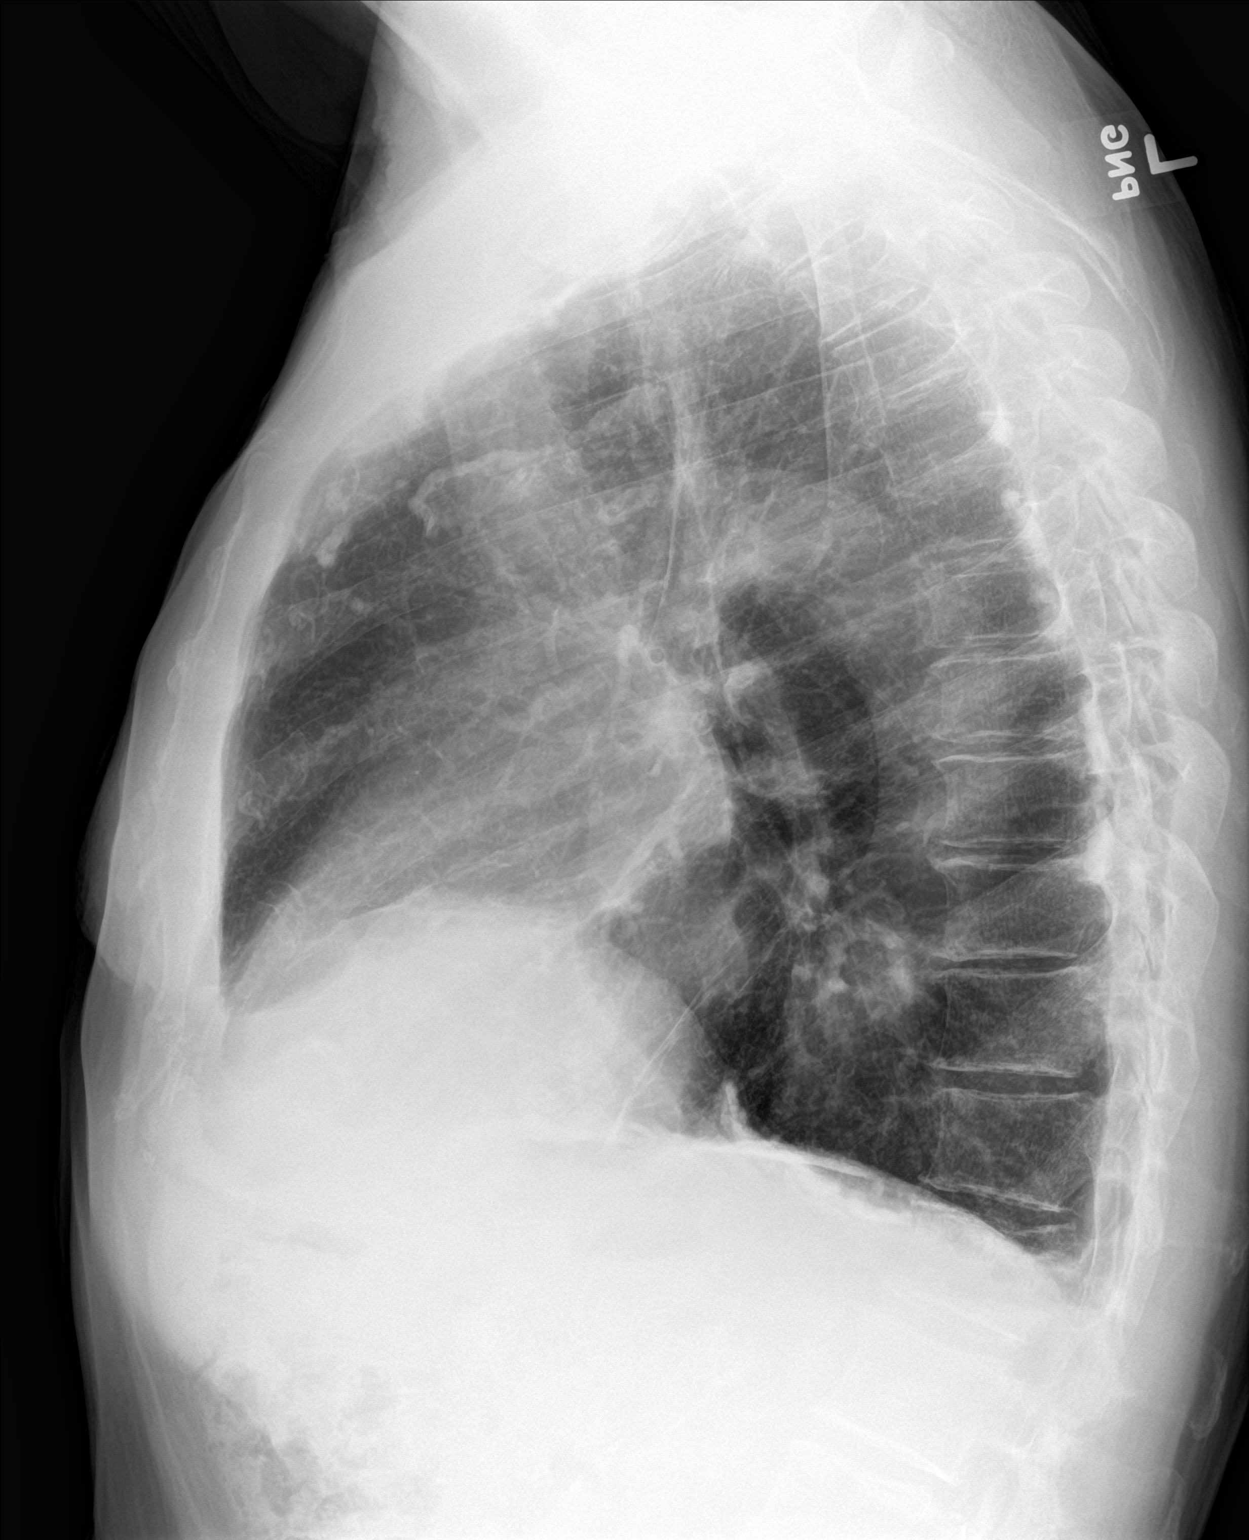

[2 of 2 positions shown; findings below may reference images not displayed]

FINDINGS: Bilateral calcified pleural plaques are again noted. Mild elevation
of the right hemidiaphragm, stable. Heart is normal size. No acute
airspace opacities or effusions. No acute bony abnormality.
IMPRESSION: Stable calcified bilateral pleural plaques.

Stable elevation of the right hemidiaphragm.

No active disease.

## 2018-05-19 DIAGNOSIS — I350 Nonrheumatic aortic (valve) stenosis: Secondary | ICD-10-CM | POA: Diagnosis not present

## 2018-05-19 DIAGNOSIS — I272 Pulmonary hypertension, unspecified: Secondary | ICD-10-CM | POA: Diagnosis not present

## 2018-05-19 DIAGNOSIS — I5031 Acute diastolic (congestive) heart failure: Secondary | ICD-10-CM | POA: Diagnosis not present

## 2018-05-19 DIAGNOSIS — M5441 Lumbago with sciatica, right side: Secondary | ICD-10-CM | POA: Diagnosis not present

## 2018-05-19 DIAGNOSIS — M542 Cervicalgia: Secondary | ICD-10-CM | POA: Diagnosis not present

## 2018-05-19 DIAGNOSIS — J449 Chronic obstructive pulmonary disease, unspecified: Secondary | ICD-10-CM | POA: Diagnosis not present

## 2018-06-18 DIAGNOSIS — M545 Low back pain: Secondary | ICD-10-CM | POA: Diagnosis not present

## 2018-06-18 DIAGNOSIS — Z79899 Other long term (current) drug therapy: Secondary | ICD-10-CM | POA: Diagnosis not present

## 2018-06-18 DIAGNOSIS — R600 Localized edema: Secondary | ICD-10-CM | POA: Diagnosis not present

## 2018-06-18 DIAGNOSIS — J449 Chronic obstructive pulmonary disease, unspecified: Secondary | ICD-10-CM | POA: Diagnosis not present

## 2018-07-08 DIAGNOSIS — H524 Presbyopia: Secondary | ICD-10-CM | POA: Diagnosis not present

## 2018-07-08 DIAGNOSIS — D23121 Other benign neoplasm of skin of left upper eyelid, including canthus: Secondary | ICD-10-CM | POA: Diagnosis not present

## 2018-07-08 DIAGNOSIS — H00014 Hordeolum externum left upper eyelid: Secondary | ICD-10-CM | POA: Diagnosis not present

## 2018-07-13 ENCOUNTER — Other Ambulatory Visit: Payer: Self-pay | Admitting: Internal Medicine

## 2018-07-21 DIAGNOSIS — C44319 Basal cell carcinoma of skin of other parts of face: Secondary | ICD-10-CM | POA: Diagnosis not present

## 2018-07-21 DIAGNOSIS — D225 Melanocytic nevi of trunk: Secondary | ICD-10-CM | POA: Diagnosis not present

## 2018-07-21 DIAGNOSIS — D1801 Hemangioma of skin and subcutaneous tissue: Secondary | ICD-10-CM | POA: Diagnosis not present

## 2018-07-21 DIAGNOSIS — L300 Nummular dermatitis: Secondary | ICD-10-CM | POA: Diagnosis not present

## 2018-07-21 DIAGNOSIS — L814 Other melanin hyperpigmentation: Secondary | ICD-10-CM | POA: Diagnosis not present

## 2018-07-21 DIAGNOSIS — L821 Other seborrheic keratosis: Secondary | ICD-10-CM | POA: Diagnosis not present

## 2018-07-21 DIAGNOSIS — Z85828 Personal history of other malignant neoplasm of skin: Secondary | ICD-10-CM | POA: Diagnosis not present

## 2018-07-21 DIAGNOSIS — B353 Tinea pedis: Secondary | ICD-10-CM | POA: Diagnosis not present

## 2018-07-21 DIAGNOSIS — C4441 Basal cell carcinoma of skin of scalp and neck: Secondary | ICD-10-CM | POA: Diagnosis not present

## 2018-07-21 DIAGNOSIS — C44311 Basal cell carcinoma of skin of nose: Secondary | ICD-10-CM | POA: Diagnosis not present

## 2018-07-30 DIAGNOSIS — J449 Chronic obstructive pulmonary disease, unspecified: Secondary | ICD-10-CM | POA: Diagnosis not present

## 2018-07-30 DIAGNOSIS — I2510 Atherosclerotic heart disease of native coronary artery without angina pectoris: Secondary | ICD-10-CM | POA: Diagnosis not present

## 2018-07-30 DIAGNOSIS — I5031 Acute diastolic (congestive) heart failure: Secondary | ICD-10-CM | POA: Diagnosis not present

## 2018-08-13 DIAGNOSIS — Z85828 Personal history of other malignant neoplasm of skin: Secondary | ICD-10-CM | POA: Diagnosis not present

## 2018-08-13 DIAGNOSIS — C4441 Basal cell carcinoma of skin of scalp and neck: Secondary | ICD-10-CM | POA: Diagnosis not present

## 2018-09-16 DIAGNOSIS — J449 Chronic obstructive pulmonary disease, unspecified: Secondary | ICD-10-CM | POA: Diagnosis not present

## 2018-09-16 DIAGNOSIS — I5031 Acute diastolic (congestive) heart failure: Secondary | ICD-10-CM | POA: Diagnosis not present

## 2018-09-16 DIAGNOSIS — I2510 Atherosclerotic heart disease of native coronary artery without angina pectoris: Secondary | ICD-10-CM | POA: Diagnosis not present

## 2018-09-23 ENCOUNTER — Telehealth: Payer: Self-pay

## 2018-09-23 NOTE — Telephone Encounter (Signed)
Spoke with pt about options for appt; he will call back with decision.

## 2018-09-24 NOTE — Telephone Encounter (Signed)
   Primary Cardiologist:  No primary care provider on file.   Patient contacted.  History reviewed.  No symptoms to suggest any unstable cardiac conditions.  Based on discussion, with current pandemic situation, we will be postponing this appointment for [name] [name] with a plan for f/u in 6-12 wks or sooner if feasible/necessary.  If symptoms change, he has been instructed to contact our office.   Routing to C19 CANCEL pool for tracking (P CV DIV CV19 CANCEL - reason for visit "other.") and assigning priority (1 = 4-6 wks, 2 = 6-12 wks, 3 = >12 wks).   Emmaline Life, RN  09/24/2018 11:13 AM         .

## 2018-09-28 ENCOUNTER — Ambulatory Visit: Payer: PPO | Admitting: Cardiovascular Disease

## 2018-10-14 ENCOUNTER — Telehealth: Payer: Self-pay | Admitting: Cardiovascular Disease

## 2018-10-14 NOTE — Telephone Encounter (Signed)
Spoke with patient to reschedule his March appointment with Dr. Acie Fredrickson to [facility].  Patient declined virtual visit and decided to schedule with Dr. Acie Fredrickson in August.

## 2018-12-13 ENCOUNTER — Other Ambulatory Visit: Payer: Self-pay

## 2018-12-13 ENCOUNTER — Encounter: Payer: Self-pay | Admitting: Internal Medicine

## 2018-12-13 ENCOUNTER — Ambulatory Visit (INDEPENDENT_AMBULATORY_CARE_PROVIDER_SITE_OTHER): Payer: PPO | Admitting: Internal Medicine

## 2018-12-13 DIAGNOSIS — J4489 Other specified chronic obstructive pulmonary disease: Secondary | ICD-10-CM

## 2018-12-13 DIAGNOSIS — J449 Chronic obstructive pulmonary disease, unspecified: Secondary | ICD-10-CM

## 2018-12-13 DIAGNOSIS — M51379 Other intervertebral disc degeneration, lumbosacral region without mention of lumbar back pain or lower extremity pain: Secondary | ICD-10-CM

## 2018-12-13 DIAGNOSIS — M5137 Other intervertebral disc degeneration, lumbosacral region: Secondary | ICD-10-CM

## 2018-12-13 DIAGNOSIS — F5101 Primary insomnia: Secondary | ICD-10-CM | POA: Diagnosis not present

## 2018-12-13 DIAGNOSIS — M5136 Other intervertebral disc degeneration, lumbar region: Secondary | ICD-10-CM

## 2018-12-13 MED ORDER — TRAZODONE HCL 50 MG PO TABS: ORAL_TABLET | ORAL | 5 refills | Status: DC

## 2018-12-13 NOTE — Patient Instructions (Signed)
Script sent to try Trazodone, 1/2 or 1 tab at bedtime for sleep as needed  Ok to continue current breathing meds  Please call if we can help

## 2018-12-13 NOTE — Progress Notes (Signed)
Subjective:    Patient ID: [name] [name], male    DOB: Dec 05, 1927, 85 y.o.   MRN: [id number]  HPI M former smoker followed for chronic obstructive asthma, Allergic rhintiis,  COPD, suspected asbestos exposure after working in [employer]. CAD, PHTN 6MWT 02/16/15- 99%, 98%, 98%, 555 feet, no oxygen limitation. Walked at slow pace with an unsteady gait Overnight oximetry 02/21/2015 did qualify for sleep O2 EchoCardiogram 10/21/16-moderate pulmonary hypertension  (50 S) ---------------------------------------------------------------------------------------  10/08/17- 85 year old male former smoker followed for chronic obstructive asthma/steroid dependent, allergic rhinitis, nasal polyps, suspected asbestos exposure after working in [employer], complicated by history SVT/palpitation/ CAD, moderate aortic stenosis, PHTN adrenal insufficiency O2 2L/ sleep/no DME-he owns his concentrator. ----COPD: Pt states his breathing is about the same over the years; denies any wheezing, SOB with exertion (limits his movement), or cough. Limited by legs and back pain, uses rolling walker.  No longer active enough to be aware of dyspnea on exertion; little cough or wheeze.  He follows his peak flow meter noting that rescue inhaler can raise peak flow from 250-300.  [name] remains on chronic maintenance adrenal replacement prednisone 5 mg x2 tabs daily. CXR 04/21/16 IMPRESSION: Stable calcified bilateral pleural plaques. Stable elevation of the right hemidiaphragm. No active disease. EchoCardiogram 10/21/16-moderate pulmonary hypertension  (43 S)  12/13/2018- 85 year old male former smoker followed for chronic obstructive asthma/steroid dependent, allergic rhinitis, nasal polyps, suspected asbestos exposure after working in [employer], complicated by history SVT/palpitation/ CAD, moderate aortic stenosis, PHTN adrenal insufficiency, chronic leg and back pain O2 2L/ sleep/no DME-he owns his  concentrator. COPD with asthma. Patient reports breathing is about the same since last visit. -----Patient states that he uses rescue inhaler or neb 1-2 times a week. Symbicort. He remainns limited by spinal stenosis with leg and back pain. Little bothered by his breathing, with no acute episodes. He no longer sleeps with his oxygen. Difficulty getting to sleep- failed melatonin and benadryl.   ROS-see HPI   + = positive Constitutional:   + weight loss, night sweats, fevers, chills, fatigue, lassitude. HEENT:   + headaches, difficulty swallowing, tooth/dental problems, sore throat,       No-  sneezing, itching, ear ache, +nasal congestion, post nasal drip,  CV:  No-   chest pain, orthopnea, PND, swelling in lower extremities, anasarca,  dizziness, +palpitations Resp: + shortness of breath with exertion or at rest.              No-   productive cough,  No non-productive cough,  No- coughing up of blood.              No-   change in color of mucus.  + wheezing.   Skin: No-   rash or lesions. GI:  No-   heartburn, indigestion, abdominal pain, nausea, vomiting, GU:  MS:  + joint pain or swelling.  Neuro-     +chronic paresthesias and weakness in lower extremities Psych:  No- change in mood or affect. No depression or anxiety.  No memory loss.  OBJ General- Alert, Oriented, Affect-appropriate, Distress- none acute, + weight loss Skin- rash-none, lesions- none, excoriation- none Lymphadenopathy- none Head- atraumatic            Eyes- Gross vision intact, PERRLA, conjunctivae clear secretions            Ears- Hearing, canals-normal            Nose- + mild turbinate edema, polyps-none seen this visit  no-Septal dev, mucus,  erosion, perforation             Throat- Mallampati II , mucosa clear , drainage- none, tonsils- atrophic Neck- flexible , trachea midline, no stridor , thyroid nl, carotid no bruit Chest - symmetrical excursion , unlabored           Heart/CV- ,+  Irregular with frequent extrasystoles, +5/3 systolic murmur , no gallop  ,                            - JVD- none , edema- none, stasis changes- none, varices- none           Lung- +diminished but clear to P&A, wheeze- none, cough- none , dullness-none, rub- none           Chest wall-  Abd-  Br/ Gen/ Rectal- Not done, not indicated Extrem- cyanosis- none, clubbing, none, atrophy- none, strength- nl, + cane Neuro- +tremor hands

## 2018-12-14 DIAGNOSIS — M5136 Other intervertebral disc degeneration, lumbar region: Secondary | ICD-10-CM | POA: Insufficient documentation

## 2018-12-14 DIAGNOSIS — G4700 Insomnia, unspecified: Secondary | ICD-10-CM | POA: Insufficient documentation

## 2018-12-14 DIAGNOSIS — M5137 Other intervertebral disc degeneration, lumbosacral region: Secondary | ICD-10-CM | POA: Insufficient documentation

## 2018-12-14 NOTE — Assessment & Plan Note (Signed)
Discussed sleep hygiene and his O2. Ok to try trazodone, starting with 1/2 pill, if needed.

## 2018-12-14 NOTE — Assessment & Plan Note (Signed)
Well controlled with no exacerbation. Not feeling need to use O2 for sleep. He's comfortable. We discussed re-trial to see if he slept any better w O2, but otherwise, at age 85, I'm ok with him doing what feels right.

## 2018-12-14 NOTE — Assessment & Plan Note (Signed)
Limiting back pain.

## 2019-01-18 DIAGNOSIS — R7309 Other abnormal glucose: Secondary | ICD-10-CM | POA: Diagnosis not present

## 2019-01-18 DIAGNOSIS — Z0000 Encounter for general adult medical examination without abnormal findings: Secondary | ICD-10-CM | POA: Diagnosis not present

## 2019-01-18 DIAGNOSIS — G629 Polyneuropathy, unspecified: Secondary | ICD-10-CM | POA: Diagnosis not present

## 2019-01-18 DIAGNOSIS — Z79899 Other long term (current) drug therapy: Secondary | ICD-10-CM | POA: Diagnosis not present

## 2019-01-18 DIAGNOSIS — I350 Nonrheumatic aortic (valve) stenosis: Secondary | ICD-10-CM | POA: Diagnosis not present

## 2019-01-18 DIAGNOSIS — I272 Pulmonary hypertension, unspecified: Secondary | ICD-10-CM | POA: Diagnosis not present

## 2019-01-18 DIAGNOSIS — Z1331 Encounter for screening for depression: Secondary | ICD-10-CM | POA: Diagnosis not present

## 2019-01-18 DIAGNOSIS — J449 Chronic obstructive pulmonary disease, unspecified: Secondary | ICD-10-CM | POA: Diagnosis not present

## 2019-01-18 DIAGNOSIS — J9611 Chronic respiratory failure with hypoxia: Secondary | ICD-10-CM | POA: Diagnosis not present

## 2019-01-18 DIAGNOSIS — R21 Rash and other nonspecific skin eruption: Secondary | ICD-10-CM | POA: Diagnosis not present

## 2019-01-24 DIAGNOSIS — J449 Chronic obstructive pulmonary disease, unspecified: Secondary | ICD-10-CM | POA: Diagnosis not present

## 2019-01-24 DIAGNOSIS — I2510 Atherosclerotic heart disease of native coronary artery without angina pectoris: Secondary | ICD-10-CM | POA: Diagnosis not present

## 2019-01-24 DIAGNOSIS — I5031 Acute diastolic (congestive) heart failure: Secondary | ICD-10-CM | POA: Diagnosis not present

## 2019-01-28 NOTE — Progress Notes (Signed)
Patient ID: [name] [name], male   DOB: 02-Dec-1927, 85 y.o.   MRN: [id number]     Patient ID: [name] [name] MRN: [id number] DOB/AGE: [birth year] 85 y.o.   Referring Physician Dr. Felipa Eth  Problem List: 1. Coronary artery disease-status post remote stenting 2. Moderate aortic stenosis 3. Premature atrial contractions 4. Point hypertension-moderate    Previous notes from Dr. Irish Lack Reason for Consultation fatigue, CAD, palpitations  HPI: 85 y/o with COPD.  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  His activity is limited from lower extremity neuropathy.  He had a stent many years ago.  He followed with Dr. Leonia Reeves until a few years ago.  I   He has had more fatigue of late. His activity is decreasing due to his shortness of breath. Because of this, he would like to have a cardiac evaluation. Has been several years since he saw Dr. Leonia Reeves. Prior to his stent, he had some decreased exertion as well. No chest pain.   Recent echo in 3/16 showed normal LV function and moderate aortic stenosis.  Neuropathy is limiting exercise.   In the morning, he feels that his HR is irregular and elevated.  He will bear down and sx get better.    Oct 30 2016:   Transfer from Dr. Irish Lack Retired from [employer]  ([place])  Has palpitations ,   No hx of syncope.   No CP or dyspnea Still does his yard work.   Went to the gym until recently .   Has some leg issues.   March 15, 2018: [name] [name] is seen back today  Has COPD - is not more short of breath compared to usual He had an echocardiogram in August that revealed that his aortic valve gradient had now increased to a mean of 40.  This is up from last year which revealed a mean aortic valve gradient of 30 mmHg.  He denies any syncope or chest pain.  He has some baseline shortness of breath because of his COPD but is no more short of breath any used to be.  Still fairly active. Has spinal stenosis .     Aug. 6, 2020  [name] [name] is seen today for follow up of his aortic stenosis. Has severe AS.    Has spinal stensis,  Uses a walker  Or cane to walk     Current Outpatient Medications  Medication Sig Dispense Refill  . albuterol (PROAIR HFA) 108 (90 BASE) MCG/ACT inhaler 2 puffs 4 times a day as needed 3 Inhaler prn  . albuterol (PROVENTIL) (2.5 MG/3ML) 0.083% nebulizer solution USE ONE VIAL VIA NEBULIZATION BY MOUTH DAILY OR TWICE A DAY 150 mL 1  . ALPRAZolam (XANAX) 1 MG tablet Take 1 mg by mouth daily.    . budesonide-formoterol (SYMBICORT) 80-4.5 MCG/ACT inhaler Inhale 2 puffs into the lungs 2 (two) times daily.      Marland Kitchen ipratropium (ATROVENT) 0.02 % nebulizer solution 1 VIAL VIA NEBULIZER EVERY 8 HOURS AS NEEDED FOR WHEEZING AND FOR SHORTNESS OF BREATH 150 mL 1  . ketoconazole (NIZORAL) 2 % cream Reported on 07/30/2015    . LYRICA 75 MG capsule Take 1 capsule by mouth 2 (two) times daily.    . mometasone (NASONEX) 50 MCG/ACT nasal spray Place 2 sprays into the nose daily. 1-2 puffs in each nostril daily (Patient not taking: Reported on 12/13/2018) 17 g prn  . predniSONE (DELTASONE) 5 MG tablet TAKE TWO OR THREE TABLETS BY MOUTH  DAILY OR AS DIRECTED 270 tablet 2  . terazosin (HYTRIN) 5 MG capsule Take 5 mg by mouth daily. Once a day     . traZODone (DESYREL) 50 MG tablet One tab at bedtime for sleep 30 tablet 5  . triamcinolone cream (KENALOG) 0.1 %      No current facility-administered medications for this visit.    Past Medical History:  Diagnosis Date  . Adrenal insufficiency ([place])   . Allergic rhinitis   . Aortic stenosis, moderate   . Asthma   . Chronic sinusitis   . COPD (chronic obstructive pulmonary disease) ([place])   . H/O: asbestos exposure   . Nasal polyps   . PAC (premature atrial contraction)    EKG 12/03/09    Family History  Problem Relation Age of Onset  . Heart disease Father   . Alzheimer's disease Mother   . Heart disease Paternal Uncle     Social History    Socioeconomic History  . Marital status: Married    Spouse name: Not on file  . Number of children: Not on file  . Years of education: Not on file  . Highest education level: Not on file  Occupational History  . Occupation: Armed forces training and education officer  Social Needs  . Financial resource strain: Not on file  . Food insecurity    Worry: Not on file    Inability: Not on file  . Transportation needs    Medical: Not on file    Non-medical: Not on file  Tobacco Use  . Smoking status: Former Smoker    Packs/day: 0.50    Years: 20.00    Pack years: 10.00    Types: Cigarettes    Quit date: 06/30/1973    Years since quitting: 45.6  . Smokeless tobacco: Never Used  Substance and Sexual Activity  . Alcohol use: No    Alcohol/week: 0.0 standard drinks  . Drug use: No  . Sexual activity: Not on file  Lifestyle  . Physical activity    Days per week: Not on file    Minutes per session: Not on file  . Stress: Not on file  Relationships  . Social Herbalist on phone: Not on file    Gets together: Not on file    Attends religious service: Not on file    Active member of club or organization: Not on file    Attends meetings of clubs or organizations: Not on file    Relationship status: Not on file  . Intimate partner violence    Fear of current or ex partner: Not on file    Emotionally abused: Not on file    Physically abused: Not on file    Forced sexual activity: Not on file  Other Topics Concern  . Not on file  Social History Narrative  . Not on file    Past Surgical History:  Procedure Laterality Date  . HERNIA REPAIR    . TONSILLECTOMY       (Not in a hospital admission)   Review of systems complete and found to be negative unless listed above .  No nausea, vomiting.  No fever chills, No focal weakness,  No palpitations.  Physical Exam:  Physical Exam: There were no vitals taken for this visit.  GEN:   Elderly male,  NAD ,   HEENT: Normal NECK: No JVD; No carotid  bruits LYMPHATICS: No lymphadenopathy CARDIAC: RRR ,  3/6 systolic murmur . RESPIRATORY:  Clear to auscultation  without rales, wheezing or rhonchi  ABDOMEN: Soft, non-tender, non-distended MUSCULOSKELETAL:  No edema; No deformity  SKIN: Warm and dry NEUROLOGIC:  Alert and oriented x 3  Labs:    EKG:        ASSESSMENT AND PLAN:  1.  Coronary artery disease:  No angina    2.  Aortic stenosis - .  Mean gradient is 41 mmHg.   Will repeat echo.  Refer  to valve  / TAVR clinic .   He is not having severe symptoms but I suspect he would if he did more   3. Pulmonary HTN:    PACs:     Stable   Mertie Moores, MD  01/28/2019 3:48 PM    [address],  [place], [place]  [address] Pager [phone] Phone: [phone]; Fax: [phone]

## 2019-02-04 ENCOUNTER — Ambulatory Visit: Payer: PPO | Admitting: Cardiovascular Disease

## 2019-02-04 ENCOUNTER — Other Ambulatory Visit: Payer: Self-pay

## 2019-02-04 ENCOUNTER — Encounter: Payer: Self-pay | Admitting: Cardiovascular Disease

## 2019-02-04 VITALS — BP 116/68 | HR 63 | Ht 70.0 in | Wt 164.8 lb

## 2019-02-04 DIAGNOSIS — I2510 Atherosclerotic heart disease of native coronary artery without angina pectoris: Secondary | ICD-10-CM | POA: Diagnosis not present

## 2019-02-04 DIAGNOSIS — I350 Nonrheumatic aortic (valve) stenosis: Secondary | ICD-10-CM | POA: Diagnosis not present

## 2019-02-04 NOTE — Patient Instructions (Signed)
Medication Instructions:  Your physician recommends that you continue on your current medications as directed. Please refer to the Current Medication list given to you today.  If you need a refill on your cardiac medications before your next appointment, please call your pharmacy.   Lab work: None Ordered   Testing/Procedures: Your physician has requested that you have an echocardiogram. Echocardiography is a painless test that uses sound waves to create images of your heart. It provides your doctor with information about the size and shape of your heart and how well your heart's chambers and valves are working. This procedure takes approximately one hour. There are no restrictions for this procedure.    Follow-Up: You have been referred to [facility] for discussion of TAVR (Transaortic valve replacement)   At [facility], you and your health needs are our priority.  As part of our continuing mission to provide you with exceptional heart care, we have created designated Provider Care Teams.  These Care Teams include your primary Cardiologist (physician) and Advanced Practice Providers (APPs -  Physician Assistants and Nurse Practitioners) who all work together to provide you with the care you need, when you need it. You will need a follow up appointment in:  6 months.  Please call our office 2 months in advance to schedule this appointment.  You may see Dr. Acie Fredrickson or one of the following Advanced Practice Providers on your designated Care Team: Richardson Dopp, PA-C [place], [place] . Daune Perch, NP

## 2019-02-11 ENCOUNTER — Ambulatory Visit (HOSPITAL_COMMUNITY): Payer: PPO | Attending: Cardiology

## 2019-02-11 ENCOUNTER — Other Ambulatory Visit: Payer: Self-pay

## 2019-02-11 DIAGNOSIS — I350 Nonrheumatic aortic (valve) stenosis: Secondary | ICD-10-CM | POA: Diagnosis not present

## 2019-02-21 ENCOUNTER — Ambulatory Visit (INDEPENDENT_AMBULATORY_CARE_PROVIDER_SITE_OTHER): Payer: PPO | Admitting: Cardiovascular Disease

## 2019-02-21 ENCOUNTER — Institutional Professional Consult (permissible substitution): Payer: PPO | Admitting: Cardiovascular Disease

## 2019-02-21 ENCOUNTER — Other Ambulatory Visit: Payer: Self-pay

## 2019-02-21 ENCOUNTER — Encounter: Payer: Self-pay | Admitting: Cardiovascular Disease

## 2019-02-21 VITALS — BP 100/60 | HR 100 | Ht 70.0 in | Wt 160.0 lb

## 2019-02-21 DIAGNOSIS — I350 Nonrheumatic aortic (valve) stenosis: Secondary | ICD-10-CM | POA: Diagnosis not present

## 2019-02-21 NOTE — Progress Notes (Signed)
[facility]   MULTIDISCIPLINARY HEART VALVE TEAM  Date:  02/22/2019   ID:  SULEIMAN FINIGAN, DOB [birth year], MRN [id number]  PCP:  [name], MD   Chief Complaint  Patient presents with  . Fatigue     HISTORY OF PRESENT ILLNESS: MANDEEP KISER is a 85 y.o. male who presents for evaluation of severe aortic stenosis, referred by Dr Acie Fredrickson.  The patient is here with his wife today. He does not recall hearing about a heart murmur in the past, but has been told he has 'a skip in his heartbeat.' The patient han been followed by Dr Annamaria Boots for COPD.  He is limited by spinal stenosis and peripheral neuropathy. He is moderately limited but states he is able to walk for 20 minutes without stopping to rest. He has mild shortness of breath with this level of activity. He has home O2 but doesn't use it. He ambulates with a cane and a walker, depending on what specific activity he is doing.  He denies chest pain, chest pressure, orthopnea, PND, or heart palpitations.  He has had no lightheadedness or syncope.  The patient has had regular dental care and reports several issues. He has had dental implants but they fell out. He is missing several teeth but states that he's not currently having any issues. .  Past Medical History:  Diagnosis Date  . Adrenal insufficiency ([place])   . Allergic rhinitis   . Aortic stenosis, moderate   . Asthma   . Chronic sinusitis   . COPD (chronic obstructive pulmonary disease) ([place])   . H/O: asbestos exposure   . Nasal polyps   . PAC (premature atrial contraction)    EKG 12/03/09    Current Outpatient Medications  Medication Sig Dispense Refill  . albuterol (PROAIR HFA) 108 (90 BASE) MCG/ACT inhaler 2 puffs 4 times a day as needed 3 Inhaler prn  . albuterol (PROVENTIL) (2.5 MG/3ML) 0.083% nebulizer solution USE ONE VIAL VIA NEBULIZATION BY MOUTH DAILY OR TWICE A DAY 150 mL 1  . ALPRAZolam (XANAX) 1 MG tablet Take 1 mg by mouth daily.    Marland Kitchen  ipratropium (ATROVENT) 0.02 % nebulizer solution 1 VIAL VIA NEBULIZER EVERY 8 HOURS AS NEEDED FOR WHEEZING AND FOR SHORTNESS OF BREATH 150 mL 1  . ketoconazole (NIZORAL) 2 % cream Reported on 07/30/2015    . LYRICA 75 MG capsule Take 1 capsule by mouth daily.     . predniSONE (DELTASONE) 5 MG tablet TAKE TWO OR THREE TABLETS BY MOUTH DAILY OR AS DIRECTED 270 tablet 2  . SYMBICORT 160-4.5 MCG/ACT inhaler     . terazosin (HYTRIN) 5 MG capsule Take 5 mg by mouth daily. Once a day     . traZODone (DESYREL) 50 MG tablet One tab at bedtime for sleep 30 tablet 5  . triamcinolone cream (KENALOG) 0.1 %      No current facility-administered medications for this visit.     ALLERGIES:   Fenofibrate, Ipratropium, and Zocor  [simvastatin]   SOCIAL HISTORY:  The patient  reports that he quit smoking about 45 years ago. His smoking use included cigarettes. He has a 10.00 pack-year smoking history. He has never used smokeless tobacco. He reports that he does not drink alcohol or use drugs.   FAMILY HISTORY:  The patient's family history includes Alzheimer's disease in his mother; Heart disease in his father and paternal uncle.   REVIEW OF SYSTEMS:  All other systems are reviewed and negative.  PHYSICAL EXAM: VS:  BP 100/60   Pulse 100   Ht _0  (1.778 m)   Wt 160 lb (72.6 kg)   SpO2 96%   BMI 22.96 kg/m  , BMI Body mass index is 22.96 kg/m. GEN: Well nourished, well developed, in no acute distress HEENT: normal Neck: No JVD. carotids 2+ with bilateral bruits  Cardiac: The heart is RRR with a grade 4/6 late peaking harsh systolic murmur heard throughout.  1+ bilateral ankle edema. Respiratory:  clear to auscultation bilaterally GI: soft, nontender, nondistended, + BS MS: no deformity or atrophy Skin: warm and dry, no rash Neuro:  Strength and sensation are intact Psych: euthymic mood, full affect  RECENT LABS: No results found for requested labs within last 8760 hours.  No results found for  requested labs within last 8760 hours.   CrCl cannot be calculated (No successful lab value found.).   Wt Readings from Last 3 Encounters:  02/21/19 160 lb (72.6 kg)  02/04/19 164 lb 12.8 oz (74.8 kg)  12/13/18 170 lb 6.4 oz (77.3 kg)     CARDIAC STUDIES:  Echo:  IMPRESSIONS    1. The left ventricle has normal systolic function with an ejection fraction of 60-65%. The cavity size was normal. There is mildly increased left ventricular wall thickness. Left ventricular diastolic Doppler parameters are consistent with impaired  relaxation. Elevated mean left atrial pressure.  2. The right ventricle has normal systolic function. The cavity was normal.  3. Left atrial size was severely dilated.  4. The mitral valve is abnormal. There is severe mitral annular calcification present. Mild mitral valve stenosis.  5. The tricuspid valve is grossly normal.  6. The aorta is abnormal unless otherwise noted.  7. There is mild dilatation of the ascending aorta measuring 41 mm.  8. Normal LV systolic function; mild LVH; mild diastolic dysfunction; mildly dilated ascending aorta; aortic valve not well visualized but heavily calcified; severe AS (mean gradient 37 mmHg; peak velocity 4.1 m/s); severe MAC with mild MS (mean  gradient 4 mmHg; MVA 1.6 cm2); severe LAE.   STS RISK CALCULATOR: Isolated AVR Risk of Mortality: 3.222% Renal Failure: 2.279% Permanent Stroke: 1.479% Prolonged Ventilation: 8.750% DSW Infection: 0.082% Reoperation: 4.266% Morbidity or Mortality: 14.726% Short Length of Stay: 22.500% Long Length of Stay: 8.056%   ASSESSMENT AND PLAN: 13.  85 yo male with severe, Stage D1 aortic stenosis with NYHA functional class 2 symptoms of chronic diastolic heart failure. I have reviewed the natural history of aortic stenosis with the patient and their family members who are present today. We have discussed the limitations of medical therapy and the poor prognosis associated with symptomatic  aortic stenosis. We have reviewed potential treatment options, including palliative medical therapy, conventional surgical aortic valve replacement, and transcatheter aortic valve replacement. We discussed treatment options in the context of the patient's specific comorbid medical conditions.  The patient's echo images are personally reviewed.  I have also reviewed previous echo studies to evaluate for progression of his aortic stenosis over time.  The patient's mean transaortic gradient increased from 20 mmHg in 2017 to 30 mmHg in 2018 to 41 mmHg in 2019, and is now reported at 37 mmHg on his current echo study.  However, in review of his echo images the mean gradient is dependent on the RR interval and there are gradients measured as high as 46 mmHg.  The patient has fairly mild symptoms, likely because of other physical limitations with spinal stenosis.  He  ambulates slowly with a walker.  Considering the severity of his aortic stenosis and progressive changes seen over serial echo studies, I think it would be reasonable to proceed with evaluation for transcatheter aortic valve replacement.  The patient and his wife are counseled extensively about the procedure, including the work-up involved, the expected procedural outcomes, and specifically the risks associated with the procedure.  They would like to discuss further before making a final decision on whether to proceed.  They understand that if they would like to move forward with treatment, the patient will require a right and left heart catheterization as well as CT angiography studies of the heart and the chest, abdomen, and pelvis.  He then would undergo formal cardiac surgical consultation as part of a multidisciplinary approach to his care.  He also had many questions about timing as it relates to the COVID-19 pandemic and we discussed this at length today.   Deatra James 02/22/2019 9:43 AM     [facility] [address]  [place] [address]  [phone] (office) [phone] (fax)

## 2019-02-21 NOTE — Patient Instructions (Addendum)
Please call us if you decide you would like to proceed!

## 2019-02-22 ENCOUNTER — Encounter: Payer: Self-pay | Admitting: Cardiovascular Disease

## 2019-03-02 ENCOUNTER — Telehealth: Payer: Self-pay | Admitting: Internal Medicine

## 2019-03-02 NOTE — Telephone Encounter (Signed)
ATC line busy 

## 2019-03-03 NOTE — Telephone Encounter (Signed)
ATC pt but the line was busy X 2.

## 2019-03-03 NOTE — Telephone Encounter (Signed)
Spoke with pt, he has been diagnosed with aortic stenosis. He states the symbicort is not working for him and he is wondering if there is something else he can take. He tried the Atrovent but it made his heart rate increase but it did help a little. He would like to speak to CY. Please call pt.

## 2019-03-03 NOTE — Telephone Encounter (Signed)
Patient is returning phone call.  Patient phone number is [phone].

## 2019-03-04 NOTE — Telephone Encounter (Signed)
Per foyer staff, pt was given the sample Incruse Ellipta and an appt has been scheduled for 03/23/2019 at 11:30 AM EDT per CY. Nothing further needed at this time.

## 2019-03-04 NOTE — Telephone Encounter (Signed)
Will route message to Dr. Young to make him aware. 

## 2019-03-04 NOTE — Telephone Encounter (Signed)
He would like to try something with less cardiac stimulation potential than Symbicort. He has aortic stenosis but is also concerned about rapid heart rate.  Order- 1) Please leave sample Incruse Ellipta at front desk for him to pickup this afternoon   Inhale 1 puff, once daily, instead of Symbicort.              2) please work him in on my schedule in about 2 weeks.

## 2019-03-04 NOTE — Telephone Encounter (Signed)
Patient is calling to speak to Dr. Annamaria Boots today.  Patient phone number is [phone].

## 2019-03-23 ENCOUNTER — Encounter: Payer: Self-pay | Admitting: Internal Medicine

## 2019-03-23 ENCOUNTER — Ambulatory Visit (INDEPENDENT_AMBULATORY_CARE_PROVIDER_SITE_OTHER): Payer: PPO

## 2019-03-23 ENCOUNTER — Other Ambulatory Visit: Payer: Self-pay

## 2019-03-23 ENCOUNTER — Ambulatory Visit (INDEPENDENT_AMBULATORY_CARE_PROVIDER_SITE_OTHER): Payer: PPO | Admitting: Internal Medicine

## 2019-03-23 VITALS — BP 116/70 | HR 70 | Temp 97.4°F | Ht 70.0 in | Wt 159.4 lb

## 2019-03-23 DIAGNOSIS — J4489 Other specified chronic obstructive pulmonary disease: Secondary | ICD-10-CM

## 2019-03-23 DIAGNOSIS — I350 Nonrheumatic aortic (valve) stenosis: Secondary | ICD-10-CM | POA: Diagnosis not present

## 2019-03-23 DIAGNOSIS — M5136 Other intervertebral disc degeneration, lumbar region: Secondary | ICD-10-CM

## 2019-03-23 DIAGNOSIS — M51379 Other intervertebral disc degeneration, lumbosacral region without mention of lumbar back pain or lower extremity pain: Secondary | ICD-10-CM

## 2019-03-23 DIAGNOSIS — J449 Chronic obstructive pulmonary disease, unspecified: Secondary | ICD-10-CM | POA: Diagnosis not present

## 2019-03-23 DIAGNOSIS — F5101 Primary insomnia: Secondary | ICD-10-CM

## 2019-03-23 DIAGNOSIS — M5137 Other intervertebral disc degeneration, lumbosacral region: Secondary | ICD-10-CM

## 2019-03-23 MED ORDER — ALBUTEROL SULFATE HFA 108 (90 BASE) MCG/ACT IN AERS: INHALATION_SPRAY | RESPIRATORY_TRACT | 12 refills | Status: DC

## 2019-03-23 NOTE — Assessment & Plan Note (Signed)
Trazodone 50 mg doesn't help. Suggested he minimize xanax. Try Trazodone 2 x 50 mg at bedtime.

## 2019-03-23 NOTE — Progress Notes (Signed)
Subjective:    Patient ID: [name] [name], male    DOB: 03-Aug-1927, 85 y.o.   MRN: [id number]  HPI M former smoker followed for chronic obstructive asthma, Allergic rhintiis,  COPD, suspected asbestos exposure after working in [employer]. CAD, PHTN 6MWT 02/16/15- 99%, 98%, 98%, 555 feet, no oxygen limitation. Walked at slow pace with an unsteady gait Overnight oximetry 02/21/2015 did qualify for sleep O2 EchoCardiogram 10/21/16-moderate pulmonary hypertension  (26 S) ---------------------------------------------------------------------------------------  12/13/2018- 85 year old male former smoker followed for chronic obstructive asthma/steroid dependent, allergic rhinitis, nasal polyps, suspected asbestos exposure after working in [employer], complicated by history SVT/palpitation/ CAD, moderate aortic stenosis, PHTN adrenal insufficiency, chronic leg and back pain O2 2L/ sleep/no DME-he owns his concentrator. COPD with asthma. Patient reports breathing is about the same since last visit. -----Patient states that he uses rescue inhaler or neb 1-2 times a week. Symbicort. He remainns limited by spinal stenosis with leg and back pain. Little bothered by his breathing, with no acute episodes. He no longer sleeps with his oxygen. Difficulty getting to sleep- failed melatonin and benadryl.   03/23/2019- 85 year old male former smoker followed for chronic obstructive asthma/steroid dependent, allergic rhinitis, nasal polyps, suspected asbestos exposure after working in [employer], complicated by history SVT/palpitation/ CAD, moderate aortic stenosis, PHTN adrenal insufficiency, chronic leg and back pain O2 2L/ sleep/no DME-he owns his concentrator. At [place] we gave Trazodone for sleep. He asked for less cardiac stimulating inhaler so given sample Incruse to try instead of Symbicort. He has met w Dr Burt Knack Cardiology and discussed possible TAVR for his severe Aortic Stenosis.  -----pt recently  given sample of Incruse Ellipta, pt states the Incruse "kept him breathing"; however, he states he's only been able to get up to 200 on flowmeter Incruse causing some increased prostatism. Neb ipratropium done occasionally, helps breathing the most. Activity is limited more by his leg and back pain.  Pending flu vax with wife at drug store.   ROS-see HPI   + = positive Constitutional:   + weight loss, night sweats, fevers, chills, fatigue, lassitude. HEENT:   + headaches, difficulty swallowing, tooth/dental problems, sore throat,       No-  sneezing, itching, ear ache, +nasal congestion, post nasal drip,  CV:  No-   chest pain, orthopnea, PND, swelling in lower extremities, anasarca,  dizziness, +palpitations Resp: + shortness of breath with exertion or at rest.              No-   productive cough,  No non-productive cough,  No- coughing up of blood.              No-   change in color of mucus.  + wheezing.   Skin: No-   rash or lesions. GI:  No-   heartburn, indigestion, abdominal pain, nausea, vomiting, GU:  MS:  + joint pain or swelling.  Neuro-     +chronic paresthesias and weakness in lower extremities Psych:  No- change in mood or affect. No depression or anxiety.  No memory loss.  OBJ General- Alert, Oriented, Affect-appropriate, Distress- none acute, + weight loss Skin- rash-none, lesions- none, excoriation- none Lymphadenopathy- none Head- atraumatic            Eyes- Gross vision intact, PERRLA, conjunctivae clear secretions            Ears- Hearing, canals-normal            Nose- + mild turbinate edema, polyps-none seen this visit  no-Septal dev, mucus,  erosion, perforation             Throat- Mallampati II , mucosa clear , drainage- none, tonsils- atrophic Neck- flexible , trachea midline, no stridor , thyroid nl, carotid no bruit Chest - symmetrical excursion , unlabored           Heart/CV- ,+ Irregular with frequent extrasystoles, +5-5/0 systolic  murmur , no gallop  ,                            - JVD- none , edema- none, stasis changes- none, varices- none           Lung- +diminished but clear to P&A, slow expiratory flow,  wheeze- none, cough- none , dullness-none, rub- none           Chest wall-  Abd-  Br/ Gen/ Rectal- Not done, not indicated Extrem- cyanosis- none, clubbing, none, atrophy- none, strength- nl, + cane Neuro- +tremor hands

## 2019-03-23 NOTE — Patient Instructions (Signed)
Order- CXR     Dx COPD mixed type  Suggest you try using your nebulizer with ipratropium once daily and also                                              Symbicort 1 puff twice daily See if that combination keeps your breathing comfortable without too mucch prostate trouble.  Try increasing the trazodone to 2 ( total 100 mg) at bedtime for sleep  Please call if we can help

## 2019-03-23 NOTE — Assessment & Plan Note (Addendum)
No recent exacerbation. Suggested compromise use of meds: try neb Ipratropium once daily, Symbicort 1 puff twice daily. CXR

## 2019-03-23 NOTE — Assessment & Plan Note (Signed)
Ongoing low back and leg pains. He is not considering surgery, due to age.

## 2019-03-23 NOTE — Assessment & Plan Note (Signed)
He is debating whether to try TAVR and will follow up with cardiology on this.

## 2019-03-29 DIAGNOSIS — I5031 Acute diastolic (congestive) heart failure: Secondary | ICD-10-CM | POA: Diagnosis not present

## 2019-03-29 DIAGNOSIS — I2510 Atherosclerotic heart disease of native coronary artery without angina pectoris: Secondary | ICD-10-CM | POA: Diagnosis not present

## 2019-03-29 DIAGNOSIS — J449 Chronic obstructive pulmonary disease, unspecified: Secondary | ICD-10-CM | POA: Diagnosis not present

## 2019-03-29 DIAGNOSIS — I272 Pulmonary hypertension, unspecified: Secondary | ICD-10-CM | POA: Diagnosis not present

## 2019-04-04 ENCOUNTER — Telehealth: Payer: Self-pay | Admitting: Internal Medicine

## 2019-04-04 NOTE — Telephone Encounter (Signed)
ATC pt's wife, tried the home number and received a busy signal x2 and tried her cell phone number and her voicemail isn't set up. Will try back.

## 2019-04-05 MED ORDER — PREDNISONE 5 MG PO TABS: ORAL_TABLET | ORAL | 2 refills | Status: DC

## 2019-04-05 NOTE — Telephone Encounter (Signed)
Called and spoke w/ pt wife, Izora Gala (on [place]), regarding the refill of pt's medication. Izora Gala verbalized understanding with no additional questions. Nothing further needed at this time.

## 2019-04-05 NOTE — Telephone Encounter (Signed)
Prednisone refill sent to his drug store on [address]/

## 2019-04-05 NOTE — Telephone Encounter (Signed)
ATC home number and line busy  Called other number listed and NA, no option to leave a msg   Dr Annamaria Boots- I want to be sure it's okay to refill the prednisone 5 mg  I do not see this mentioned on last AVS but it was last dispensed on 10/08/17 for #270 tablets directions take 2-3 tabs daily  Please advise on refills, thanks

## 2019-04-05 NOTE — Telephone Encounter (Signed)
Pt calling again wants to know how come Prednisone has not been called into [facility] for him yet

## 2019-04-29 DIAGNOSIS — J449 Chronic obstructive pulmonary disease, unspecified: Secondary | ICD-10-CM | POA: Diagnosis not present

## 2019-04-29 DIAGNOSIS — I272 Pulmonary hypertension, unspecified: Secondary | ICD-10-CM | POA: Diagnosis not present

## 2019-04-29 DIAGNOSIS — I5031 Acute diastolic (congestive) heart failure: Secondary | ICD-10-CM | POA: Diagnosis not present

## 2019-04-29 DIAGNOSIS — E7800 Pure hypercholesterolemia, unspecified: Secondary | ICD-10-CM | POA: Diagnosis not present

## 2019-04-29 DIAGNOSIS — I2510 Atherosclerotic heart disease of native coronary artery without angina pectoris: Secondary | ICD-10-CM | POA: Diagnosis not present

## 2019-05-13 DIAGNOSIS — L820 Inflamed seborrheic keratosis: Secondary | ICD-10-CM | POA: Diagnosis not present

## 2019-05-13 DIAGNOSIS — C4321 Malignant melanoma of right ear and external auricular canal: Secondary | ICD-10-CM | POA: Diagnosis not present

## 2019-05-13 DIAGNOSIS — Z85828 Personal history of other malignant neoplasm of skin: Secondary | ICD-10-CM | POA: Diagnosis not present

## 2019-05-13 DIAGNOSIS — D485 Neoplasm of uncertain behavior of skin: Secondary | ICD-10-CM | POA: Diagnosis not present

## 2019-05-13 DIAGNOSIS — L300 Nummular dermatitis: Secondary | ICD-10-CM | POA: Diagnosis not present

## 2019-05-20 ENCOUNTER — Other Ambulatory Visit: Payer: Self-pay

## 2019-05-20 ENCOUNTER — Encounter: Payer: Self-pay | Admitting: Plastic Surgery

## 2019-05-20 ENCOUNTER — Ambulatory Visit (INDEPENDENT_AMBULATORY_CARE_PROVIDER_SITE_OTHER): Payer: PPO | Admitting: Plastic Surgery

## 2019-05-20 DIAGNOSIS — C439 Malignant melanoma of skin, unspecified: Secondary | ICD-10-CM | POA: Diagnosis not present

## 2019-05-20 NOTE — Progress Notes (Signed)
Patient ID: [name] [name], male    DOB: Sep 28, 1927, 85 y.o.   MRN: [id number]   Chief Complaint  Patient presents with  . Skin Problem    The patient is an 85 year old male here with his wife for evaluation of his right ear.  He was seen by the dermatologist and had a biopsy of the ear changing skin lesion.  Pathology was positive for malignant melanoma.  It was a shave biopsy.  The specimen indicates it was right lateral external ear at the antihelix.  It is mixed epithelioid and spindled at least 2.3 mm depth and at least a Clark level IV.  He has a history of skin cancer in the past.  Today the posterior portion of his ear has an area that is 3 cm hyperpigmentation.  The anterior portion has some ulceration and involves a 4 cm area.  He is very thin.  It does feel like he has 1 anterior cervical lymph node that is enlarged but no tenderness.  He has severe COPD.  He is otherwise in good health.  He has had surgery in the past but nothing very recent.   Review of Systems  Constitutional: Negative.  Negative for appetite change.  Eyes: Negative.   Respiratory: Positive for shortness of breath. Negative for chest tightness.   Cardiovascular: Negative.   Gastrointestinal: Negative.   Endocrine: Negative.   Musculoskeletal: Negative.   Skin: Positive for color change and wound.  Neurological: Negative.   Hematological: Negative.   Psychiatric/Behavioral: Negative.     Past Medical History:  Diagnosis Date  . Adrenal insufficiency ([place])   . Allergic rhinitis   . Aortic stenosis, moderate   . Asthma   . Chronic sinusitis   . COPD (chronic obstructive pulmonary disease) ([place])   . H/O: asbestos exposure   . Nasal polyps   . PAC (premature atrial contraction)    EKG 12/03/09    Past Surgical History:  Procedure Laterality Date  . HERNIA REPAIR    . TONSILLECTOMY        Current Outpatient Medications:  .  albuterol (PROAIR HFA) 108 (90 Base) MCG/ACT inhaler, 2 puffs 4 times  a day as needed, Disp: 8 g, Rfl: 12 .  albuterol (PROVENTIL) (2.5 MG/3ML) 0.083% nebulizer solution, USE ONE VIAL VIA NEBULIZATION BY MOUTH DAILY OR TWICE A DAY, Disp: 150 mL, Rfl: 1 .  ALPRAZolam (XANAX) 1 MG tablet, Take 1 mg by mouth daily., Disp: , Rfl:  .  ipratropium (ATROVENT) 0.02 % nebulizer solution, 1 VIAL VIA NEBULIZER EVERY 8 HOURS AS NEEDED FOR WHEEZING AND FOR SHORTNESS OF BREATH, Disp: 150 mL, Rfl: 1 .  ketoconazole (NIZORAL) 2 % cream, Reported on 07/30/2015, Disp: , Rfl:  .  LYRICA 75 MG capsule, Take 1 capsule by mouth daily. , Disp: , Rfl:  .  predniSONE (DELTASONE) 5 MG tablet, TAKE TWO OR THREE TABLETS BY MOUTH DAILY OR AS DIRECTED, Disp: 270 tablet, Rfl: 2 .  SYMBICORT 160-4.5 MCG/ACT inhaler, , Disp: , Rfl:  .  terazosin (HYTRIN) 5 MG capsule, Take 5 mg by mouth daily. Once a day , Disp: , Rfl:  .  traZODone (DESYREL) 50 MG tablet, One tab at bedtime for sleep, Disp: 30 tablet, Rfl: 5 .  triamcinolone cream (KENALOG) 0.1 %, , Disp: , Rfl:    Objective:   Vitals:   05/20/19 1232  BP: 113/63  Pulse: 87  Temp: (!) 97.3 F (36.3 C)  SpO2: 96%  Physical Exam Vitals signs and nursing note reviewed.  Constitutional:      Appearance: Normal appearance.  HENT:     Ears:   Eyes:     Extraocular Movements: Extraocular movements intact.  Cardiovascular:     Rate and Rhythm: Normal rate.     Pulses: Normal pulses.  Pulmonary:     Effort: Pulmonary effort is normal.  Abdominal:     General: Abdomen is flat. There is no distension.     Tenderness: There is no abdominal tenderness.  Skin:    General: Skin is warm.     Capillary Refill: Capillary refill takes less than 2 seconds.  Neurological:     General: No focal deficit present.     Mental Status: He is alert and oriented to person, place, and time.  Psychiatric:        Mood and Affect: Mood normal.        Behavior: Behavior normal.        Thought Content: Thought content normal.     Assessment & Plan:   Melanoma of skin ([place])  Plan for resection of melanoma pink coordination with ENT. We will get him set up to see Dr. Constance Holster for possible cervical lymph node dissection.  This would be something we can work on together.  I also called Dr. Elvera Lennox at [facility] dermatology to discuss with him and I have left a message [phone].  I will also send this to the melanoma conference for discussion at our next meeting. Pictures were obtained of the patient and placed in the chart with the patient's or guardian's permission.  [place], DO

## 2019-05-24 DIAGNOSIS — C4321 Malignant melanoma of right ear and external auricular canal: Secondary | ICD-10-CM | POA: Diagnosis not present

## 2019-05-24 DIAGNOSIS — J439 Emphysema, unspecified: Secondary | ICD-10-CM | POA: Diagnosis not present

## 2019-05-24 DIAGNOSIS — C439 Malignant melanoma of skin, unspecified: Secondary | ICD-10-CM | POA: Diagnosis not present

## 2019-05-24 DIAGNOSIS — Z9981 Dependence on supplemental oxygen: Secondary | ICD-10-CM | POA: Diagnosis not present

## 2019-05-30 ENCOUNTER — Telehealth: Payer: Self-pay | Admitting: Internal Medicine

## 2019-05-30 NOTE — Telephone Encounter (Signed)
Dr. Annamaria Boots please advise if you have received this request and if not please advise.

## 2019-05-31 NOTE — Telephone Encounter (Signed)
ATC and it states patient is not available on the preferred number and no voicemail. The mobile number was no answer and no voicemail has been set up.

## 2019-05-31 NOTE — Telephone Encounter (Signed)
I have not received a request for medical clearance on this patient.

## 2019-06-01 ENCOUNTER — Other Ambulatory Visit (HOSPITAL_COMMUNITY): Payer: Self-pay | Admitting: Otolaryngology

## 2019-06-01 ENCOUNTER — Other Ambulatory Visit: Payer: Self-pay | Admitting: Otolaryngology

## 2019-06-01 DIAGNOSIS — C4321 Malignant melanoma of right ear and external auricular canal: Secondary | ICD-10-CM

## 2019-06-01 NOTE — Telephone Encounter (Signed)
Anderson Malta with Dr. Janeice Robinson office calling to check status of surgery clearance - please advise - Direct Line - [phone] - secure VM if need to leave VM -

## 2019-06-01 NOTE — Telephone Encounter (Signed)
ATC Dr. Janeice Robinson office. The line rings several times with no one coming to the line. Will try back.

## 2019-06-01 NOTE — Telephone Encounter (Signed)
LMTCB- left VM letting Anderson Malta know that we have not received any request

## 2019-06-02 ENCOUNTER — Telehealth: Payer: Self-pay | Admitting: Cardiovascular Disease

## 2019-06-02 ENCOUNTER — Other Ambulatory Visit: Payer: Self-pay

## 2019-06-02 ENCOUNTER — Other Ambulatory Visit: Payer: Self-pay | Admitting: Otolaryngology

## 2019-06-02 ENCOUNTER — Encounter: Payer: Self-pay | Admitting: Cardiovascular Disease

## 2019-06-02 ENCOUNTER — Ambulatory Visit: Payer: PPO | Admitting: Cardiovascular Disease

## 2019-06-02 VITALS — BP 108/60 | HR 74 | Ht 70.0 in | Wt 159.4 lb

## 2019-06-02 DIAGNOSIS — R590 Localized enlarged lymph nodes: Secondary | ICD-10-CM

## 2019-06-02 DIAGNOSIS — C439 Malignant melanoma of skin, unspecified: Secondary | ICD-10-CM

## 2019-06-02 DIAGNOSIS — I350 Nonrheumatic aortic (valve) stenosis: Secondary | ICD-10-CM

## 2019-06-02 DIAGNOSIS — I2510 Atherosclerotic heart disease of native coronary artery without angina pectoris: Secondary | ICD-10-CM | POA: Diagnosis not present

## 2019-06-02 NOTE — Telephone Encounter (Signed)
OK- will forward to Dr Annamaria Boots in this case Dr Annamaria Boots- please advise if you are okay with the pt being put under general anesthesia, thanks

## 2019-06-02 NOTE — Telephone Encounter (Signed)
Left message for [name] [name] to call back

## 2019-06-02 NOTE — Telephone Encounter (Signed)
[name] [name] calling back stating they don't have surgery clearance forms.  Please advise.  [phone]

## 2019-06-02 NOTE — Telephone Encounter (Signed)
As of last visit in September he was at clinical baseline and optimised for necessary surgery. He is 85 years old, has COPD and severe Aortic Stenosis.  He is at risk of complications related to these conditions.

## 2019-06-02 NOTE — Telephone Encounter (Signed)
Return call from [facility] [phone].[name] [name]

## 2019-06-02 NOTE — Telephone Encounter (Signed)
There are no forms Dr Veva Holes just wants to know if pt can be put under general anesthesia, just general question no forms or clearence.[name] [name]

## 2019-06-02 NOTE — Progress Notes (Signed)
Patient ID: [name] [name], male   DOB: June 20, 1928, 85 y.o.   MRN: [id number]     Patient ID: [name] [name] MRN: [id number] DOB/AGE: 08/02/1927 85 y.o.   Referring Physician [name] [name]  Problem List: 1. Coronary artery disease-status post remote stenting 2. Moderate aortic stenosis 3. Premature atrial contractions 4. Point hypertension-moderate    Previous notes from [name] [name] Reason for Consultation fatigue, CAD, palpitations  HPI: 85 y/o with COPD.  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  His activity is limited from lower extremity neuropathy.  He had a stent many years ago.  He followed with [name] [name] until a few years ago.  I   He has had more fatigue of late. His activity is decreasing due to his shortness of breath. Because of this, he would like to have a cardiac evaluation. Has been several years since he saw [name] [name]. Prior to his stent, he had some decreased exertion as well. No chest pain.   Recent echo in 3/16 showed normal LV function and moderate aortic stenosis.  Neuropathy is limiting exercise.   In the morning, he feels that his HR is irregular and elevated.  He will bear down and sx get better.    Oct 30 2016:   Transfer from [name] [name] Retired from [employer]  ([place])  Has palpitations ,   No hx of syncope.   No CP or dyspnea Still does his yard work.   Went to the gym until recently .   Has some leg issues.   March 15, 2018: [name] [name] is seen back today  Has COPD - is not more short of breath compared to usual He had an echocardiogram in August that revealed that his aortic valve gradient had now increased to a mean of 40.  This is up from last year which revealed a mean aortic valve gradient of 30 mmHg.  He denies any syncope or chest pain.  He has some baseline shortness of breath because of his COPD but is no more short of breath any used to be.  Still fairly active. Has spinal stenosis .     Aug. 6, 2020  [name] [name] is seen today for follow up of his aortic stenosis. Has severe AS.    Has spinal stensis,  Uses a walker  Or cane to walk   June 02, 2019: [name] [name] is seen today for follow-up of his aortic stenosis.  He has severe aortic stenosis and has been seen by the TAVR team.  Dr. Burt Knack was in favor of proceeding  with TAVR .  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  Has seen plastics and ENT.  Going to have an U/S on Monday   Current Outpatient Medications  Medication Sig Dispense Refill  . albuterol (PROAIR HFA) 108 (90 Base) MCG/ACT inhaler 2 puffs 4 times a day as needed 8 g 12  . albuterol (PROVENTIL) (2.5 MG/3ML) 0.083% nebulizer solution USE ONE VIAL VIA NEBULIZATION BY MOUTH DAILY OR TWICE A DAY 150 mL 1  . ALPRAZolam (XANAX) 1 MG tablet Take 1 mg by mouth daily.    . furosemide (LASIX) 20 MG tablet Take 20 mg by mouth as needed.    Marland Kitchen ipratropium (ATROVENT) 0.02 % nebulizer solution 1 VIAL VIA NEBULIZER EVERY 8 HOURS AS NEEDED FOR WHEEZING AND FOR SHORTNESS OF BREATH 150 mL 1  . ketoconazole (NIZORAL) 2 % cream Reported on 07/30/2015    .  predniSONE (DELTASONE) 5 MG tablet TAKE TWO OR THREE TABLETS BY MOUTH DAILY OR AS DIRECTED 270 tablet 2  . SYMBICORT 160-4.5 MCG/ACT inhaler     . terazosin (HYTRIN) 5 MG capsule Take 5 mg by mouth daily. Once a day     . traMADol (ULTRAM) 50 MG tablet Take 50 mg by mouth as needed.    . triamcinolone cream (KENALOG) 0.1 %      No current facility-administered medications for this visit.    Past Medical History:  Diagnosis Date  . Adrenal insufficiency ([place])   . Allergic rhinitis   . Aortic stenosis, moderate   . Asthma   . Chronic sinusitis   . COPD (chronic obstructive pulmonary disease) ([place])   . H/O: asbestos exposure   . Nasal polyps   . PAC (premature atrial contraction)    EKG 12/03/09    Family History  Problem Relation Age of Onset  . Heart disease Father   . Alzheimer's disease Mother   .  Heart disease Paternal Uncle     Social History   Socioeconomic History  . Marital status: Married    Spouse name: Not on file  . Number of children: Not on file  . Years of education: Not on file  . Highest education level: Not on file  Occupational History  . Occupation: Armed forces training and education officer  Social Needs  . Financial resource strain: Not on file  . Food insecurity    Worry: Not on file    Inability: Not on file  . Transportation needs    Medical: Not on file    Non-medical: Not on file  Tobacco Use  . Smoking status: Former Smoker    Packs/day: 0.50    Years: 20.00    Pack years: 10.00    Types: Cigarettes    Quit date: 06/30/1973    Years since quitting: 45.9  . Smokeless tobacco: Never Used  Substance and Sexual Activity  . Alcohol use: No    Alcohol/week: 0.0 standard drinks  . Drug use: No  . Sexual activity: Not on file  Lifestyle  . Physical activity    Days per week: Not on file    Minutes per session: Not on file  . Stress: Not on file  Relationships  . Social Herbalist on phone: Not on file    Gets together: Not on file    Attends religious service: Not on file    Active member of club or organization: Not on file    Attends meetings of clubs or organizations: Not on file    Relationship status: Not on file  . Intimate partner violence    Fear of current or ex partner: Not on file    Emotionally abused: Not on file    Physically abused: Not on file    Forced sexual activity: Not on file  Other Topics Concern  . Not on file  Social History Narrative  . Not on file    Past Surgical History:  Procedure Laterality Date  . HERNIA REPAIR    . TONSILLECTOMY        Review of systems complete and found to be negative unless listed above .  No nausea, vomiting.  No fever chills, No focal weakness,  No palpitations.    Physical Exam: Blood pressure 108/60, pulse 74, height 5\' 10"  (1.778 m), weight 159 lb 6.4 oz (72.3 kg), SpO2 97 %.  GEN:  Elderly, frail gentleman in no acute distress.  HEENT: Normal has a cancer growth on his right ear. NECK: No JVD; radiation of his systolic murmur to his carotids. LYMPHATICS: No lymphadenopathy CARDIAC: Irreg. Irreg.   3/6 systolic ejection murmur cholesterol border. RESPIRATORY:  Clear to auscultation without rales, wheezing or rhonchi  ABDOMEN: Soft, non-tender, non-distended MUSCULOSKELETAL:  No edema; No deformity  SKIN: Warm and dry NEUROLOGIC:  Alert and oriented x 3  Labs:   EKG:     June 02, 2019: Normal sinus rhythm with premature atrial contractions.  No ST or T wave changes.   ASSESSMENT AND PLAN:  1.  Coronary artery disease:  No angina    2.  Aortic stenosis - .  He has been seen by the TAVR team and was thought to be a reasonable candidate for TAVR.  He wanted to wait and think about it a little bit.  In the meanwhile he has been diagnosed with melanoma on his right ear.  He is having a scan next week to assess for any metastatic spread.  Given this information, I think that we need to put the TAVR plans on hold until we have a better understanding of his prognosis from a melanoma standpoint.  I will see him in 6 months for follow-up visit.  He is to call us sooner if he has any other cardiology/valvular issues.  3. Pulmonary HTN:    PACs:     Stable   Mertie Moores, MD  06/02/2019 4:46 PM    [address],  [place], [place]  [address] Pager [phone] Phone: [phone]; Fax: [phone]

## 2019-06-02 NOTE — Telephone Encounter (Signed)
lmtcb for [name] [name].  °

## 2019-06-02 NOTE — Telephone Encounter (Signed)
New Message     Pt is calling and says his wife will need to assist him to his appt today because he uses a walker     Please call

## 2019-06-02 NOTE — Telephone Encounter (Signed)
Called and left a detailed msg on machine with CDY's recs

## 2019-06-02 NOTE — Telephone Encounter (Signed)
Anderson Malta returningc all and can be reached @ [phone] it's ok to leave a detailed message of what Dr Annamaria Boots suggested.[name] [name]

## 2019-06-02 NOTE — Patient Instructions (Addendum)
Medication Instructions:  Your physician recommends that you continue on your current medications as directed. Please refer to the Current Medication list given to you today.  *If you need a refill on your cardiac medications before your next appointment, please call your pharmacy*   Lab Work: None Ordered    Testing/Procedures: None Ordered   Follow-Up: At [facility], you and your health needs are our priority.  As part of our continuing mission to provide you with exceptional heart care, we have created designated Provider Care Teams.  These Care Teams include your primary Cardiologist (physician) and Advanced Practice Providers (APPs -  Physician Assistants and Nurse Practitioners) who all work together to provide you with the care you need, when you need it.  Your next appointment:   6 month(s)  The format for your next appointment:   Either In Person or Virtual  Provider:   You may see Dr. Acie Fredrickson or one of the following Advanced Practice Providers on your designated Care Team:    Richardson Dopp, PA-C  [place], [place]  Daune Perch, [place]

## 2019-06-02 NOTE — Telephone Encounter (Signed)
Noted  

## 2019-06-06 ENCOUNTER — Other Ambulatory Visit: Payer: Self-pay

## 2019-06-06 ENCOUNTER — Ambulatory Visit (HOSPITAL_COMMUNITY)
Admission: RE | Admit: 2019-06-06 | Discharge: 2019-06-06 | Disposition: A | Discharge status: Home / Self Care | Payer: PPO | Source: Ambulatory Visit | Attending: Otolaryngology | Admitting: Otolaryngology

## 2019-06-06 DIAGNOSIS — C439 Malignant melanoma of skin, unspecified: Secondary | ICD-10-CM | POA: Diagnosis not present

## 2019-06-06 DIAGNOSIS — R590 Localized enlarged lymph nodes: Secondary | ICD-10-CM | POA: Diagnosis not present

## 2019-06-07 ENCOUNTER — Encounter (HOSPITAL_COMMUNITY): Payer: Self-pay

## 2019-06-07 NOTE — Progress Notes (Signed)
Olevia Perches "dave" Male, 85 y.o., 1927/12/27 MRN:  [id number] Phone:  [phone] (H) PCP:  [name], MD Coverage:  [employer] Ppo Next Appt With Radiology ([facility]-US 2) 06/10/2019 at 1:00 PM  RE: Biopsy Received: Today Message Contents  Arne Cleveland, MD  Ernestene Mention   [place] FNA R cerv LAN    [facility]   Previous Messages  ----- Message -----  From: Lenore Cordia  Sent: 06/07/2019 10:22 AM EST  To: Ir Procedure Requests  Subject: FW: Biopsy                    [place] Final 06/06/2019  ----- Message -----  From: Arne Cleveland, MD  Sent: 06/01/2019 12:02 PM EST  To: Lenore Cordia  Subject: RE: Biopsy                    No imaging to review   ----- Message -----  From: Lenore Cordia  Sent: 06/01/2019 11:38 AM EST  To: Ir Procedure Requests  Subject: Biopsy                      Procedure Requested: Thyroid US FNA Biopsy only    Reason for Procedure: right ear melanoma, right lefel 2 lymph node palpable. Please needle biopsy right level 2 node    Provider Requesting: Izora Gala, MD  Provider Telephone: [phone]    Other Info: Photos in [place]

## 2019-06-09 ENCOUNTER — Other Ambulatory Visit: Payer: Self-pay | Admitting: Physician Assistant

## 2019-06-10 ENCOUNTER — Other Ambulatory Visit: Payer: Self-pay

## 2019-06-10 ENCOUNTER — Ambulatory Visit (HOSPITAL_COMMUNITY)
Admission: RE | Admit: 2019-06-10 | Discharge: 2019-06-10 | Disposition: A | Discharge status: Home / Self Care | Payer: PPO | Source: Ambulatory Visit | Attending: Otolaryngology | Admitting: Otolaryngology

## 2019-06-10 DIAGNOSIS — Z7952 Long term (current) use of systemic steroids: Secondary | ICD-10-CM | POA: Insufficient documentation

## 2019-06-10 DIAGNOSIS — I350 Nonrheumatic aortic (valve) stenosis: Secondary | ICD-10-CM | POA: Insufficient documentation

## 2019-06-10 DIAGNOSIS — Z79899 Other long term (current) drug therapy: Secondary | ICD-10-CM | POA: Insufficient documentation

## 2019-06-10 DIAGNOSIS — Z888 Allergy status to other drugs, medicaments and biological substances status: Secondary | ICD-10-CM | POA: Diagnosis not present

## 2019-06-10 DIAGNOSIS — R590 Localized enlarged lymph nodes: Secondary | ICD-10-CM | POA: Diagnosis not present

## 2019-06-10 DIAGNOSIS — Z87891 Personal history of nicotine dependence: Secondary | ICD-10-CM | POA: Diagnosis not present

## 2019-06-10 DIAGNOSIS — I2510 Atherosclerotic heart disease of native coronary artery without angina pectoris: Secondary | ICD-10-CM | POA: Diagnosis not present

## 2019-06-10 DIAGNOSIS — J449 Chronic obstructive pulmonary disease, unspecified: Secondary | ICD-10-CM | POA: Diagnosis not present

## 2019-06-10 DIAGNOSIS — C4321 Malignant melanoma of right ear and external auricular canal: Secondary | ICD-10-CM | POA: Insufficient documentation

## 2019-06-10 DIAGNOSIS — D487 Neoplasm of uncertain behavior of other specified sites: Secondary | ICD-10-CM | POA: Diagnosis not present

## 2019-06-10 DIAGNOSIS — C439 Malignant melanoma of skin, unspecified: Secondary | ICD-10-CM | POA: Diagnosis present

## 2019-06-10 DIAGNOSIS — Z8249 Family history of ischemic heart disease and other diseases of the circulatory system: Secondary | ICD-10-CM | POA: Insufficient documentation

## 2019-06-10 LAB — CBC
HCT: 38.3 % — ABNORMAL LOW (ref 39.0–52.0)
Hemoglobin: 12.2 g/dL — ABNORMAL LOW (ref 13.0–17.0)
MCH: 32.2 pg (ref 26.0–34.0)
MCHC: 31.9 g/dL (ref 30.0–36.0)
MCV: 101.1 fL — ABNORMAL HIGH (ref 80.0–100.0)
Platelets: 212 10*3/uL (ref 150–400)
RBC: 3.79 MIL/uL — ABNORMAL LOW (ref 4.22–5.81)
RDW: 13.9 % (ref 11.5–15.5)
WBC: 9.7 10*3/uL (ref 4.0–10.5)
nRBC: 0 % (ref 0.0–0.2)

## 2019-06-10 LAB — PROTIME-INR
INR: 1.1 (ref 0.8–1.2)
Prothrombin Time: 13.8 seconds (ref 11.4–15.2)

## 2019-06-10 MED ORDER — SODIUM CHLORIDE 0.9 % IV SOLN: INTRAVENOUS | Status: DC

## 2019-06-10 MED ORDER — LIDOCAINE HCL (PF) 1 % IJ SOLN
INTRAMUSCULAR | Status: AC
  Filled 2019-06-10: qty 30

## 2019-06-10 NOTE — Procedures (Signed)
Interventional Radiology Procedure Note  Procedure: US guided right cervical node FNA, x 5 x 25g.   Complications: None Recommendations:  - DC home - Do not submerge - Routine care   Signed,  Dulcy Fanny. Earleen Newport, DO

## 2019-06-10 NOTE — H&P (Signed)
Chief Complaint: Patient was seen in consultation today for right cervical lymph node biopsy.   Referring Physician(s): Rosen,Jefry  Supervising Physician: Corrie Mckusick  Patient Status: [facility] - Out-pt  History of Present Illness: [name] [name] is a 85 y.o. male with a past medical history significant for asthma, nasal polyps, asbestos exposure, COPD, CAD, PACs, moderate aortic stenosis and right ear melanoma who presents today for an image guided fine needle aspiration of a right cervical lymph node. [name] [name] has a history of biopsy proven right ear melanoma who was referred to Dr. Constance Holster (ENT) for further evaluation of right cervical lymphadenopathy - he was seen on 06/10/19 and noted to have a palpable right cervical lymph level 2. Per consult note wide excision of the primary site with image directed sentinel node biopsy including the right level 2 node was recommended, however given his significant cardiac and pulmonary history it was uncertain if he would be a candidate for general anesthesia. IR has been consulted for image guided FNA of the cervical lymph node to further direct care.  [name] [name] acknowledges he "has something on my ear, I don't know what it is exactly though." He denies any complaints and reports he has been in his usual state of health. He states understanding of the requested procedure and wishes to proceed with local anesthesia only.   Past Medical History:  Diagnosis Date  . Adrenal insufficiency ([place])   . Allergic rhinitis   . Aortic stenosis, moderate   . Asthma   . Chronic sinusitis   . COPD (chronic obstructive pulmonary disease) ([place])   . H/O: asbestos exposure   . Nasal polyps   . PAC (premature atrial contraction)    EKG 12/03/09    Past Surgical History:  Procedure Laterality Date  . HERNIA REPAIR    . TONSILLECTOMY      Allergies: Fenofibrate and Zocor  [simvastatin]  Medications: Prior to Admission medications   Medication Sig Start  Date End Date Taking? Authorizing Provider  albuterol (PROAIR HFA) 108 (90 Base) MCG/ACT inhaler 2 puffs 4 times a day as needed Patient taking differently: Inhale 2 puffs into the lungs every 4 (four) hours as needed for wheezing or shortness of breath. 2 puffs 4 times a day as needed 03/23/19  Yes Young, Clinton D, MD  albuterol (PROVENTIL) (2.5 MG/3ML) 0.083% nebulizer solution USE ONE VIAL VIA NEBULIZATION BY MOUTH DAILY OR TWICE A DAY Patient taking differently: Take 2.5 mg by nebulization 2 (two) times daily as needed for wheezing or shortness of breath.  07/13/18  Yes Young, Tarri Fuller D, MD  ALPRAZolam Duanne Moron) 1 MG tablet Take 1 mg by mouth at bedtime.  10/02/16  Yes [provider]  CALCIUM-VITAMIN D PO Take 1 tablet by mouth daily.   Yes [provider]  Cholecalciferol (VITAMIN D) 50 MCG (2000 UT) CAPS Take 2,000 Units by mouth daily.   Yes [provider]  furosemide (LASIX) 20 MG tablet Take 20 mg by mouth daily as needed for edema.  04/06/19  Yes [provider]  ipratropium (ATROVENT) 0.02 % nebulizer solution 1 VIAL VIA NEBULIZER EVERY 8 HOURS AS NEEDED FOR WHEEZING AND FOR SHORTNESS OF BREATH Patient taking differently: Take 0.5 mg by nebulization every 8 (eight) hours as needed for wheezing or shortness of breath.  07/13/18  Yes Young, Tarri Fuller D, MD  ketoconazole (NIZORAL) 2 % cream Apply 1 application topically daily as needed for irritation. Reported on 07/30/2015 07/22/12  Yes [provider]  predniSONE (DELTASONE) 5 MG tablet TAKE TWO OR THREE TABLETS BY MOUTH DAILY OR AS DIRECTED Patient taking differently: Take 10 mg by mouth daily with breakfast. TAKE TWO OR THREE TABLETS BY MOUTH DAILY OR AS DIRECTED 04/05/19  Yes Young, Tarri Fuller D, MD  SYMBICORT 160-4.5 MCG/ACT inhaler Inhale 1 puff into the lungs 2 (two) times daily.  02/16/19  Yes [provider]  terazosin (HYTRIN) 5 MG capsule Take 5 mg by mouth at bedtime. Once a day    Yes  [provider]  traMADol (ULTRAM) 50 MG tablet Take 50 mg by mouth every 6 (six) hours as needed (pain).    Yes [provider]  triamcinolone cream (KENALOG) 0.1 % Apply 1 application topically 2 (two) times daily as needed (irritation).  07/12/18  Yes [provider]  vitamin B-12 (CYANOCOBALAMIN) 1000 MCG tablet Take 1,000 mcg by mouth daily.   Yes [provider]     Family History  Problem Relation Age of Onset  . Heart disease Father   . Alzheimer's disease Mother   . Heart disease Paternal Uncle     Social History   Socioeconomic History  . Marital status: Married    Spouse name: Not on file  . Number of children: Not on file  . Years of education: Not on file  . Highest education level: Not on file  Occupational History  . Occupation: Armed forces training and education officer  Tobacco Use  . Smoking status: Former Smoker    Packs/day: 0.50    Years: 20.00    Pack years: 10.00    Types: Cigarettes    Quit date: 06/30/1973    Years since quitting: 45.9  . Smokeless tobacco: Never Used  Substance and Sexual Activity  . Alcohol use: No    Alcohol/week: 0.0 standard drinks  . Drug use: No  . Sexual activity: Not on file  Other Topics Concern  . Not on file  Social History Narrative  . Not on file   Social Determinants of Health   Financial Resource Strain:   . Difficulty of Paying Living Expenses: Not on file  Food Insecurity:   . Worried About Charity fundraiser in the Last Year: Not on file  . Ran Out of Food in the Last Year: Not on file  Transportation Needs:   . Lack of Transportation (Medical): Not on file  . Lack of Transportation (Non-Medical): Not on file  Physical Activity:   . Days of Exercise per Week: Not on file  . Minutes of Exercise per Session: Not on file  Stress:   . Feeling of Stress : Not on file  Social Connections:   . Frequency of Communication with Friends and Family: Not on file  . Frequency of Social Gatherings with  Friends and Family: Not on file  . Attends Religious Services: Not on file  . Active Member of Clubs or Organizations: Not on file  . Attends Archivist Meetings: Not on file  . Marital Status: Not on file     Review of Systems: A 12 point ROS discussed and pertinent positives are indicated in the HPI above.  All other systems are negative.  Review of Systems  Constitutional: Negative for appetite change, chills and fever.  HENT: Negative for nosebleeds.   Respiratory: Positive for shortness of breath (baesline). Negative for cough.   Cardiovascular: Negative for chest pain.  Gastrointestinal: Negative for abdominal pain, blood in stool, diarrhea, nausea and vomiting.  Genitourinary: Negative  for hematuria.  Musculoskeletal: Negative for back pain.  Skin: Negative for rash and wound.  Neurological: Negative for dizziness and headaches.    Vital Signs: BP (!) 105/57   Pulse 79   Temp 98.3 F (36.8 C) (Skin)   Resp 14   Ht 5\' 10"  (1.778 m)   Wt 155 lb (70.3 kg)   SpO2 95%   BMI 22.24 kg/m   Physical Exam Vitals reviewed.  Constitutional:      General: He is not in acute distress. HENT:     Head: Normocephalic.  Cardiovascular:     Rate and Rhythm: Normal rate.     Heart sounds: Murmur present.  Pulmonary:     Effort: Pulmonary effort is normal.     Comments: Decreased breath sounds bilaterally Abdominal:     General: There is no distension.     Palpations: Abdomen is soft.     Tenderness: There is no abdominal tenderness.  Lymphadenopathy:     Cervical: Cervical adenopathy (Palpable right cervical LN; non tender) present.  Skin:    General: Skin is warm and dry.  Neurological:     Mental Status: He is alert and oriented to person, place, and time.  Psychiatric:        Mood and Affect: Mood normal.        Behavior: Behavior normal.        Thought Content: Thought content normal.        Judgment: Judgment normal.      Imaging: US SOFT TISSUE  HEAD & NECK (NON-THYROID)  Result Date: 06/06/2019 CLINICAL DATA:  85 year old male with right ear melanoma. Palpable right level 2 node. EXAM: ULTRASOUND OF HEAD/NECK SOFT TISSUES TECHNIQUE: Ultrasound examination of the head and neck soft tissues was performed in the area of clinical concern. COMPARISON:  Brain MRI 03/10/2019. FINDINGS: Grayscale and brief color Doppler images of the right lateral neck. A conspicuous hypoechoic and mildly lobulated lymph node is identified (image 6) measuring 17 x 9 x 18 millimeters. There is no normal fatty hilum. And this compares to other small regional lymph nodes which are only 3-5 millimeters short axis, some with normal hilum visible. Other regional fibromuscular architecture appears normal. IMPRESSION: Solitary right neck lymph node suspicious for nodal metastasis; much larger than other regional nodes and with loss of the normal fatty hilum (image 6). This would probably be amenable to ultrasound-guided needle biopsy if necessary. Electronically Signed   By: Genevie Ann M.D.   On: 06/06/2019 15:38    Labs:  CBC: Recent Labs    06/10/19 1223  WBC 9.7  HGB 12.2*  HCT 38.3*  PLT 212    COAGS: No results for input(s): INR, APTT in the last 8760 hours.  BMP: No results for input(s): NA, K, CL, CO2, GLUCOSE, BUN, CALCIUM, CREATININE, GFRNONAA, GFRAA in the last 8760 hours.  Invalid input(s): CMP  LIVER FUNCTION TESTS: No results for input(s): BILITOT, AST, ALT, ALKPHOS, PROT, ALBUMIN in the last 8760 hours.  TUMOR MARKERS: No results for input(s): AFPTM, CEA, CA199, CHROMGRNA in the last 8760 hours.  Assessment and Plan:  85 y/o M with recently diagnosed melanoma of the right ear noted to have palpable right cervical lymphadenopathy followed by ENT who presents today for an image guided cervical lymph node FNA to further direct care.  Patient has been NPO since 7 am however he requests local anesthesia only - he will remain NPO and IV will stay in  place for now should  he change his mind. Afebrile, WBC 9.7, hgb 12.2, plt 212, INR 1.1.  Risks and benefits of right cervical lymph node biopsy was discussed with the patient and/or patient's family including, but not limited to bleeding, infection, damage to adjacent structures or low yield requiring additional tests.  All of the questions were answered and there is agreement to proceed.  Consent signed and in chart.  Thank you for this interesting consult.  I greatly enjoyed meeting ELYON YOUNGKIN and look forward to participating in their care.  A copy of this report was sent to the requesting provider on this date.  Electronically Signed: Joaquim Nam, PA-C 06/10/2019, 1:04 PM   I spent a total of  30 Minutes   in face to face in clinical consultation, greater than 50% of which was counseling/coordinating care for cervical lymph node biopsy.

## 2019-06-10 NOTE — Discharge Instructions (Signed)

## 2019-06-14 LAB — CYTOLOGY - NON PAP

## 2019-06-20 ENCOUNTER — Telehealth: Payer: Self-pay | Admitting: Plastic Surgery

## 2019-06-21 NOTE — Telephone Encounter (Signed)
Called patient to advise of date for procedure; lvm on home phone; unable to leave message on mobile due to vm not set up/full. We can schedule his procedure for 07/20/19 at 1pm. If something else comes available before that day then we can call him.

## 2019-06-22 NOTE — Telephone Encounter (Signed)
Patient called back; Advised him of surgery date/time. He was hesitant to accept the date/time, but reassured him that we would add him to the wait list so if we received a cancellation that would allow for enough time for the procedure, we would call him. Notified Frances Furbish so we could open up time and block [place] from additional surgeries.

## 2019-07-07 ENCOUNTER — Ambulatory Visit (INDEPENDENT_AMBULATORY_CARE_PROVIDER_SITE_OTHER): Payer: PPO | Admitting: Plastic Surgery

## 2019-07-07 ENCOUNTER — Other Ambulatory Visit (HOSPITAL_COMMUNITY)
Admission: RE | Admit: 2019-07-07 | Discharge: 2019-07-07 | Disposition: A | Discharge status: Home / Self Care | Payer: PPO | Source: Ambulatory Visit | Attending: Plastic Surgery | Admitting: Plastic Surgery

## 2019-07-07 ENCOUNTER — Encounter: Payer: Self-pay | Admitting: Plastic Surgery

## 2019-07-07 ENCOUNTER — Ambulatory Visit: Payer: PPO | Admitting: Plastic Surgery

## 2019-07-07 ENCOUNTER — Other Ambulatory Visit: Payer: Self-pay

## 2019-07-07 VITALS — BP 99/67 | HR 78 | Temp 98.6°F | Ht 70.0 in | Wt 162.2 lb

## 2019-07-07 DIAGNOSIS — L820 Inflamed seborrheic keratosis: Secondary | ICD-10-CM | POA: Diagnosis not present

## 2019-07-07 DIAGNOSIS — C439 Malignant melanoma of skin, unspecified: Secondary | ICD-10-CM | POA: Diagnosis not present

## 2019-07-07 NOTE — Progress Notes (Signed)
Procedure Note  Preoperative Dx: Melanoma of the right ear  Postoperative Dx: Same  Procedure: Excision of right ear  Anesthesia: Lidocaine 1% with 1:100,000 epinepherine  Indication for Procedure: Melanoma Clark IV  Description of Procedure: Risks and complications were explained to the patient and his wife.  Consent was confirmed and the patient understands the risks and benefits.  The potential complications and alternatives were explained and the patient consents.  The pathology from 05/13/19 was reviewed with the patient and his wife.  We reviewed the options from re-excision, skin excision and ear resection.  We discussed the pros and cons for the each.  Due to his overall medical condition they decided on complete excision.   The patient expressed understanding the option of not having the procedure and the risks of a scar.  Time out was called and all information was confirmed to be correct.    The area was prepped and drapped.  A regional block was done for the ear using Lidocaine 1% with epinepherine.  After waiting several minutes for the local to take affect the bovie was used to excise the marked area that included the helix, antihelix, antitragus and a portion of the concha.  The goal was for 2 cm margins.  The size was therefore the ear ~4 cm plus the 2 cm margin.  This included skin and cartilage.  A combination of 4-0 and 5-0 Monocryl was used to close the skin edges. The antitragus was preserved.  The posterior and anterior skin edges were brought together after 1 cm of undermining posteriorly to gain a tension free closure.  A vaseline dressing was applied.    The patient was given instructions on how to care for the area and a follow up appointment.  [name] [name] tolerated the procedure well and there were no complications. The specimen was sent to pathology.  The [place] was signed into law in 2016 which includes the topic of electronic health records.  This provides  immediate access to information in MyChart.  This includes consultation notes, operative notes, office notes, lab results and pathology reports.  If you have any questions about what you read please let us know at your next visit or call us at the office.  We are right here with you.

## 2019-07-08 MED ORDER — HYDROCODONE-ACETAMINOPHEN 5-325 MG PO TABS: 1.0000 | ORAL_TABLET | Freq: Four times a day (QID) | ORAL | 0 refills | Status: AC | PRN

## 2019-07-08 MED ORDER — AMOXICILLIN-POT CLAVULANATE 500-125 MG PO TABS: 1.0000 | ORAL_TABLET | Freq: Three times a day (TID) | ORAL | 0 refills | Status: AC

## 2019-07-08 NOTE — Addendum Note (Signed)
Addended by: Wallace Going on: 07/08/2019 11:24 AM   Modules accepted: Orders

## 2019-07-12 LAB — SURGICAL PATHOLOGY

## 2019-07-14 ENCOUNTER — Telehealth: Payer: Self-pay | Admitting: Plastic Surgery

## 2019-07-14 ENCOUNTER — Telehealth: Payer: Self-pay

## 2019-07-14 NOTE — Telephone Encounter (Signed)
Call to pt to inform him of the pathology report as per Dr. Marla Roe- she advised that the specimen was a lipoma  Pt reports that he is doing well & denies any complications- he has a f/u visit tomorrow 07/15/19

## 2019-07-14 NOTE — Telephone Encounter (Signed)

## 2019-07-15 ENCOUNTER — Other Ambulatory Visit: Payer: Self-pay

## 2019-07-15 ENCOUNTER — Ambulatory Visit (INDEPENDENT_AMBULATORY_CARE_PROVIDER_SITE_OTHER): Payer: PPO | Admitting: Plastic Surgery

## 2019-07-15 ENCOUNTER — Encounter: Payer: Self-pay | Admitting: Plastic Surgery

## 2019-07-15 VITALS — BP 125/76 | HR 83 | Temp 98.2°F | Ht 70.0 in | Wt 164.4 lb

## 2019-07-15 DIAGNOSIS — C439 Malignant melanoma of skin, unspecified: Secondary | ICD-10-CM

## 2019-07-15 NOTE — Progress Notes (Signed)
   Subjective:    Patient ID: [name] [name], male    DOB: 1928-02-18, 86 y.o.   MRN: [id number]  The patient is an 86 year old male here with his wife for follow-up after undergoing excision of a melanoma of his ear.  The area appears to be healing well.  There is no sign of infection.     Review of Systems  Constitutional: Negative.   HENT: Negative.   Respiratory: Negative.   Genitourinary: Negative.   Hematological: Negative.   Psychiatric/Behavioral: Negative.        Objective:   Physical Exam Vitals and nursing note reviewed.  Cardiovascular:     Rate and Rhythm: Normal rate.  Pulmonary:     Effort: Pulmonary effort is normal.  Neurological:     Mental Status: He is alert.  Psychiatric:        Mood and Affect: Mood normal.        Behavior: Behavior normal.        Thought Content: Thought content normal.       Assessment & Plan:     ICD-10-CM   1. Melanoma of skin ([place])  C43.9     I reviewed the pathology with the patient.  I would like to take the sutures out next week.  Continue with Vaseline.  The [place] was signed into law in 2016 which includes the topic of electronic health records.  This provides immediate access to information in MyChart.  This includes consultation notes, operative notes, office notes, lab results and pathology reports.  If you have any questions about what you read please let us know at your next visit or call us at the office.  We are right here with you.

## 2019-07-20 ENCOUNTER — Ambulatory Visit: Payer: PPO | Admitting: Plastic Surgery

## 2019-07-20 ENCOUNTER — Ambulatory Visit: Payer: PPO | Attending: Internal Medicine

## 2019-07-20 DIAGNOSIS — Z23 Encounter for immunization: Secondary | ICD-10-CM | POA: Insufficient documentation

## 2019-07-20 NOTE — Progress Notes (Signed)
   Covid-19 Vaccination Clinic  Name:  [name] [name]    MRN: [id number] DOB: 07/30/1927  07/20/2019  [name] was observed post Covid-19 immunization for 15 minutes without incidence. He was provided with Vaccine Information Sheet and instruction to access the V-Safe system.   [name] was instructed to call 911 with any severe reactions post vaccine: Marland Kitchen Difficulty breathing  . Swelling of your face and throat  . A fast heartbeat  . A bad rash all over your body  . Dizziness and weakness    Immunizations Administered    Name Date Dose VIS Date Route   Pfizer COVID-19 Vaccine 07/20/2019  3:36 PM 0.3 mL 06/10/2019 Intramuscular   Manufacturer: [place]   Lot: [id number]   [place]: [id number]

## 2019-07-21 ENCOUNTER — Telehealth: Payer: Self-pay | Admitting: Plastic Surgery

## 2019-07-21 NOTE — Telephone Encounter (Signed)

## 2019-07-22 ENCOUNTER — Other Ambulatory Visit: Payer: Self-pay

## 2019-07-22 ENCOUNTER — Ambulatory Visit (INDEPENDENT_AMBULATORY_CARE_PROVIDER_SITE_OTHER): Payer: PPO | Admitting: Plastic Surgery

## 2019-07-22 ENCOUNTER — Encounter: Payer: Self-pay | Admitting: Plastic Surgery

## 2019-07-22 VITALS — BP 109/75 | HR 103 | Temp 97.8°F | Ht 70.0 in | Wt 161.0 lb

## 2019-07-22 DIAGNOSIS — C439 Malignant melanoma of skin, unspecified: Secondary | ICD-10-CM

## 2019-07-22 NOTE — Progress Notes (Signed)
[name] [name] is doing very well.  Ear Incisions healing nicely, c/d/i. Denies pain, drainage, and HA. Remaining sutures removed.  Some scabbing present. May continue to use vaseline until this resolved.  Oncology will reach out to set up an appointment with them.    Follow up in 2 weeks for wound check.

## 2019-07-26 ENCOUNTER — Ambulatory Visit: Payer: PPO | Admitting: Internal Medicine

## 2019-07-26 ENCOUNTER — Other Ambulatory Visit: Payer: Self-pay

## 2019-07-26 ENCOUNTER — Encounter: Payer: Self-pay | Admitting: Internal Medicine

## 2019-07-26 VITALS — BP 112/62 | HR 47 | Temp 97.2°F | Ht 70.0 in | Wt 164.2 lb

## 2019-07-26 DIAGNOSIS — J449 Chronic obstructive pulmonary disease, unspecified: Secondary | ICD-10-CM | POA: Diagnosis not present

## 2019-07-26 DIAGNOSIS — G4734 Idiopathic sleep related nonobstructive alveolar hypoventilation: Secondary | ICD-10-CM

## 2019-07-26 DIAGNOSIS — I350 Nonrheumatic aortic (valve) stenosis: Secondary | ICD-10-CM

## 2019-07-26 NOTE — Assessment & Plan Note (Addendum)
Well controlled on current meds w/o exacerbation Will update documentation for replacement of old O2 concentrator, getting ONOX during sleep on room air.

## 2019-07-26 NOTE — Patient Instructions (Addendum)
Order- overnight oximetry on room air   Dx Nocturnal Hypoxemia  Ok to continue present meds  Please call if we can help

## 2019-07-26 NOTE — Progress Notes (Signed)
Subjective:    Patient ID: [name] [name], male    DOB: [birth year], 86 y.o.   MRN: [id number]  HPI M former smoker followed for chronic obstructive asthma, Allergic rhintiis,  COPD, suspected asbestos exposure after working in [employer]. CAD, PHTN 6MWT 02/16/15- 99%, 98%, 98%, 555 feet, no oxygen limitation. Walked at slow pace with an unsteady gait Overnight oximetry 02/21/2015 did qualify for sleep O2 EchoCardiogram 10/21/16-moderate pulmonary hypertension  (6 S) ---------------------------------------------------------------------------------------   03/23/2019- 86 year old male former smoker followed for chronic obstructive asthma/steroid dependent, allergic rhinitis, nasal polyps, suspected asbestos exposure after working in [employer], complicated by history SVT/palpitation/ CAD, moderate aortic stenosis, PHTN adrenal insufficiency, chronic leg and back pain O2 2L/ sleep/no DME-he owns his concentrator. At [facility] we gave Trazodone for sleep. He asked for less cardiac stimulating inhaler so given sample Incruse to try instead of Symbicort. He has met w Dr Burt Knack Cardiology and discussed possible TAVR for his severe Aortic Stenosis.  -----pt recently given sample of Incruse Ellipta, pt states the Incruse "kept him breathing"; however, he states he's only been able to get up to 200 on flowmeter Incruse causing some increased prostatism. Neb ipratropium done occasionally, helps breathing the most. Activity is limited more by his leg and back pain.  Pending flu vax with wife at drug store.   07/26/19- 86 year old male former smoker followed for chronic obstructive asthma/steroid dependent, allergic rhinitis, nasal polyps, suspected asbestos exposure after working in [employer], complicated by history SVT/palpitation/ CAD, moderate aortic stenosis, PHTN adrenal insufficiency, chronic leg and back pain, Melanoma resected R ear,  O2 2L/ sleep/no DME-he owns his concentrator. He is still  concerned about risks of anesthesia and TAVR surgery for his AS. Breathing stable with little cough. O2 concentrator wearing out. Meds work well.  Has trouble keeping mask on since large portion of R external ear resected for melanoma.  CXR 03/23/2019- No active cardiopulmonary disease.   ROS-see HPI   + = positive Constitutional:   + weight loss, night sweats, fevers, chills, fatigue, lassitude. HEENT:   + headaches, difficulty swallowing, tooth/dental problems, sore throat,       No-  sneezing, itching, ear ache, +nasal congestion, post nasal drip,  CV:  No-   chest pain, orthopnea, PND, swelling in lower extremities, anasarca,  dizziness, +palpitations Resp: + shortness of breath with exertion or at rest.              No-   productive cough,  No non-productive cough,  No- coughing up of blood.              No-   change in color of mucus.  + wheezing.   Skin: No-   rash or lesions. GI:  No-   heartburn, indigestion, abdominal pain, nausea, vomiting, GU:  MS:  + joint pain or swelling.  Neuro-     +chronic paresthesias and weakness in lower extremities Psych:  No- change in mood or affect. No depression or anxiety.  No memory loss.  OBJ General- Alert, Oriented, Affect-appropriate, Distress- none acute,  Skin- rash-none, lesions- none, excoriation- none Lymphadenopathy- none Head- atraumatic            Eyes- Gross vision intact, PERRLA, conjunctivae clear secretions            Ears- Hearing, canals-normal, + partial resection R auricle            Nose- + mild turbinate edema, polyps-none seen this visit  no-Septal dev, mucus,  erosion, perforation             Throat- Mallampati II , mucosa clear , drainage- none, tonsils- atrophic Neck- flexible , trachea midline, no stridor , thyroid nl, carotid no bruit Chest - symmetrical excursion , unlabored           Heart/CV- ,+ Irregular with frequent extrasystoles, +0-0/7 systolic murmur , no gallop  ,                             - JVD- none , edema- none, stasis changes- none, varices- none           Lung- +diminished but clear to P&A, slow expiratory flow,  wheeze- none, cough- none , dullness-none, rub- none           Chest wall-  Abd-  Br/ Gen/ Rectal- Not done, not indicated Extrem- cyanosis- none, clubbing, none, atrophy- none, strength- nl, + cane Neuro- +tremor hands

## 2019-07-26 NOTE — Assessment & Plan Note (Signed)
He is concerned about risk of stroke associated with TAVR. Plan- discuss further with cardiology.

## 2019-07-29 DIAGNOSIS — R0902 Hypoxemia: Secondary | ICD-10-CM | POA: Diagnosis not present

## 2019-07-29 DIAGNOSIS — J449 Chronic obstructive pulmonary disease, unspecified: Secondary | ICD-10-CM | POA: Diagnosis not present

## 2019-08-01 ENCOUNTER — Other Ambulatory Visit: Payer: Self-pay

## 2019-08-01 ENCOUNTER — Telehealth: Payer: Self-pay

## 2019-08-01 DIAGNOSIS — G4734 Idiopathic sleep related nonobstructive alveolar hypoventilation: Secondary | ICD-10-CM | POA: Diagnosis not present

## 2019-08-01 DIAGNOSIS — J449 Chronic obstructive pulmonary disease, unspecified: Secondary | ICD-10-CM | POA: Diagnosis not present

## 2019-08-01 NOTE — Telephone Encounter (Signed)
Spoke with patient regarding over night oximetry test done on 1/29/202. Per Dr.Young I places a order for o2 2l/minute for sleep with [facility]. Patient voice was understanding. Nothing else further needed.

## 2019-08-03 ENCOUNTER — Telehealth: Payer: Self-pay | Admitting: Oncology

## 2019-08-03 NOTE — Telephone Encounter (Signed)
Received a new pt referral from Dr. Marla Roe fro melanoma of rt ear. Pt has been scheduled to see Dr. Alen Blew on 2/10 at 11am. I sent a msg to the referring office to provide the appt to the pt since he has an appt w/them on 2/5.

## 2019-08-05 ENCOUNTER — Other Ambulatory Visit: Payer: Self-pay

## 2019-08-05 ENCOUNTER — Encounter: Payer: Self-pay | Admitting: Plastic Surgery

## 2019-08-05 ENCOUNTER — Ambulatory Visit (INDEPENDENT_AMBULATORY_CARE_PROVIDER_SITE_OTHER): Payer: PPO | Admitting: Plastic Surgery

## 2019-08-05 VITALS — BP 115/74 | HR 82 | Temp 98.2°F | Ht 70.0 in | Wt 155.0 lb

## 2019-08-05 DIAGNOSIS — C439 Malignant melanoma of skin, unspecified: Secondary | ICD-10-CM

## 2019-08-05 NOTE — Progress Notes (Signed)
The patient is an 86 year old male here with his wife for follow-up on excision of a severe melanoma of his right ear.  He is doing extremely well.  There is no sign of infection.  There is resolution of the swelling.  The incision is intact.  He is planning on getting his second Covid shot on February 9 and is scheduled to see the oncologist on February 10.  He is hoping he will have a bad reaction from the second Covid shot that would cause him to have to miss that appointment.  There is nothing more to do from a surgery standpoint due to his cardiac and pulmonary issues.  We look forward to hearing what oncology wants to do for further treatment and exam for following him clinically.  Good

## 2019-08-08 ENCOUNTER — Telehealth: Payer: Self-pay | Admitting: Oncology

## 2019-08-08 NOTE — Telephone Encounter (Signed)
Received a call from the pt's wife to r/s new pt appt appt w/Dr. Alen Blew at 2/16 at 2pm.

## 2019-08-09 ENCOUNTER — Ambulatory Visit: Payer: PPO | Attending: Internal Medicine

## 2019-08-09 DIAGNOSIS — Z23 Encounter for immunization: Secondary | ICD-10-CM | POA: Insufficient documentation

## 2019-08-09 NOTE — Progress Notes (Signed)
   Covid-19 Vaccination Clinic  Name:  [name] [name]    MRN: [id number] DOB: 06-Dec-1927  08/09/2019  Mr. [name] [name] was observed post Covid-19 immunization for 15 minutes without incidence. He was provided with Vaccine Information Sheet and instruction to access the V-Safe system.   Mr. [name] [name] was instructed to call 911 with any severe reactions post vaccine: Marland Kitchen Difficulty breathing  . Swelling of your face and throat  . A fast heartbeat  . A bad rash all over your body  . Dizziness and weakness    Immunizations Administered    Name Date Dose VIS Date Route   Pfizer COVID-19 Vaccine 08/09/2019  1:18 PM 0.3 mL 06/10/2019 Intramuscular   Manufacturer: [place]   Lot: [id number]   [place]: [id number]

## 2019-08-10 ENCOUNTER — Ambulatory Visit: Payer: PPO | Admitting: Oncology

## 2019-08-16 ENCOUNTER — Other Ambulatory Visit: Payer: Self-pay

## 2019-08-16 ENCOUNTER — Inpatient Hospital Stay: Payer: PPO | Attending: Oncology | Admitting: Oncology

## 2019-08-16 VITALS — BP 119/65 | HR 60 | Temp 98.7°F | Resp 18 | Ht 68.5 in | Wt 164.7 lb

## 2019-08-16 DIAGNOSIS — C439 Malignant melanoma of skin, unspecified: Secondary | ICD-10-CM | POA: Diagnosis not present

## 2019-08-16 NOTE — Progress Notes (Signed)
Reason for the request:   Melanoma  HPI: I was asked by Dr. Marla Roe to evaluate [name] [name] for the evaluation of melanoma.  He is an 86 year old man with history of COPD was found to have a lesion on his right ear.  He underwent biopsy performed by Dr. Elvera Lennox on May 13, 2019 of the right lateral external ear.  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  Based on these findings he was evaluated by Dr. Constance Holster and underwent the biopsy of a cervical lymph node did not show malignancy.  On January 7 he underwent excision of his right ear performed by Dr. Marla Roe.  The final pathology showed a superficial spreading/epithelioid and spindled melanoma measuring 3.8 mm with negative margins.  A further light lesion was noted with 3.4 mm nodule course and 2 cm from the primary.  A final pathological staging was T3BN1.  No lymphovascular invasion with nonbrisk tumor infiltrating lymphocytes.  Clinically, he reports no major changes in his health.  He does have marginal health related to his COPD as well as aortic stenosis.  He does ambulate with the help of walker and has not reported any falls or syncope.  His performance status and quality of life remains unchanged.  He does not report any headaches, blurry vision, syncope or seizures. Does not report any fevers, chills or sweats.  Does not report any cough, wheezing or hemoptysis.  Does not report any chest pain, palpitation, orthopnea or leg edema.  Does not report any nausea, vomiting or abdominal pain.  Does not report any constipation or diarrhea.  Does not report any skeletal complaints.    Does not report frequency, urgency or hematuria.  Does not report any skin rashes or lesions. Does not report any heat or cold intolerance.  Does not report any lymphadenopathy or petechiae.  Does not report any anxiety or depression.  Remaining review of systems is negative.    Past Medical History:  Diagnosis Date  .  Adrenal insufficiency ([place])   . Allergic rhinitis   . Aortic stenosis, moderate   . Asthma   . Chronic sinusitis   . COPD (chronic obstructive pulmonary disease) ([place])   . H/O: asbestos exposure   . Nasal polyps   . PAC (premature atrial contraction)    EKG 12/03/09  :  Past Surgical History:  Procedure Laterality Date  . HERNIA REPAIR    . TONSILLECTOMY    :   Current Outpatient Medications:  .  albuterol (PROAIR HFA) 108 (90 Base) MCG/ACT inhaler, 2 puffs 4 times a day as needed (Patient taking differently: Inhale 2 puffs into the lungs every 4 (four) hours as needed for wheezing or shortness of breath. 2 puffs 4 times a day as needed), Disp: 8 g, Rfl: 12 .  albuterol (PROVENTIL) (2.5 MG/3ML) 0.083% nebulizer solution, USE ONE VIAL VIA NEBULIZATION BY MOUTH DAILY OR TWICE A DAY (Patient taking differently: Take 2.5 mg by nebulization 2 (two) times daily as needed for wheezing or shortness of breath. ), Disp: 150 mL, Rfl: 1 .  ALPRAZolam (XANAX) 1 MG tablet, Take 1 mg by mouth at bedtime. , Disp: , Rfl:  .  CALCIUM-VITAMIN D PO, Take 1 tablet by mouth daily., Disp: , Rfl:  .  Cholecalciferol (VITAMIN D) 50 MCG (2000 UT) CAPS, Take 2,000 Units by mouth daily., Disp: , Rfl:  .  furosemide (LASIX) 20 MG tablet, Take 20 mg by mouth daily as needed for edema. , Disp: ,  Rfl:  .  ipratropium (ATROVENT) 0.02 % nebulizer solution, 1 VIAL VIA NEBULIZER EVERY 8 HOURS AS NEEDED FOR WHEEZING AND FOR SHORTNESS OF BREATH (Patient taking differently: Take 0.5 mg by nebulization every 8 (eight) hours as needed for wheezing or shortness of breath. ), Disp: 150 mL, Rfl: 1 .  ketoconazole (NIZORAL) 2 % cream, Apply 1 application topically daily as needed for irritation. Reported on 07/30/2015, Disp: , Rfl:  .  predniSONE (DELTASONE) 5 MG tablet, TAKE TWO OR THREE TABLETS BY MOUTH DAILY OR AS DIRECTED (Patient taking differently: Take 10 mg by mouth daily with breakfast. TAKE TWO OR THREE TABLETS BY MOUTH DAILY  OR AS DIRECTED), Disp: 270 tablet, Rfl: 2 .  SYMBICORT 160-4.5 MCG/ACT inhaler, Inhale 1 puff into the lungs 2 (two) times daily. , Disp: , Rfl:  .  terazosin (HYTRIN) 5 MG capsule, Take 5 mg by mouth at bedtime. Once a day , Disp: , Rfl:  .  traMADol (ULTRAM) 50 MG tablet, Take 50 mg by mouth every 6 (six) hours as needed (pain). , Disp: , Rfl:  .  triamcinolone cream (KENALOG) 0.1 %, Apply 1 application topically 2 (two) times daily as needed (irritation). , Disp: , Rfl:  .  vitamin B-12 (CYANOCOBALAMIN) 1000 MCG tablet, Take 1,000 mcg by mouth daily., Disp: , Rfl: :  Allergies  Allergen Reactions  . Fenofibrate     Other reaction(s): pancreatitis  . Zocor  [Simvastatin]     Other reaction(s): muscle weakness  :  Family History  Problem Relation Age of Onset  . Heart disease Father   . Alzheimer's disease Mother   . Heart disease Paternal Uncle   :  Social History   Socioeconomic History  . Marital status: Married    Spouse name: Not on file  . Number of children: Not on file  . Years of education: Not on file  . Highest education level: Not on file  Occupational History  . Occupation: Armed forces training and education officer  Tobacco Use  . Smoking status: Former Smoker    Packs/day: 0.50    Years: 20.00    Pack years: 10.00    Types: Cigarettes    Quit date: 06/30/1973    Years since quitting: 46.1  . Smokeless tobacco: Never Used  Substance and Sexual Activity  . Alcohol use: No    Alcohol/week: 0.0 standard drinks  . Drug use: No  . Sexual activity: Not on file  Other Topics Concern  . Not on file  Social History Narrative  . Not on file   Social Determinants of Health   Financial Resource Strain:   . Difficulty of Paying Living Expenses: Not on file  Food Insecurity:   . Worried About Charity fundraiser in the Last Year: Not on file  . Ran Out of Food in the Last Year: Not on file  Transportation Needs:   . Lack of Transportation (Medical): Not on file  . Lack of Transportation  (Non-Medical): Not on file  Physical Activity:   . Days of Exercise per Week: Not on file  . Minutes of Exercise per Session: Not on file  Stress:   . Feeling of Stress : Not on file  Social Connections:   . Frequency of Communication with Friends and Family: Not on file  . Frequency of Social Gatherings with Friends and Family: Not on file  . Attends Religious Services: Not on file  . Active Member of Clubs or Organizations: Not on file  .  Attends Archivist Meetings: Not on file  . Marital Status: Not on file  Intimate Partner Violence:   . Fear of Current or Ex-Partner: Not on file  . Emotionally Abused: Not on file  . Physically Abused: Not on file  . Sexually Abused: Not on file  :  Pertinent items are noted in HPI.  Exam:  General appearance: alert and cooperative appeared without distress. Head: atraumatic without any abnormalities.  Partial right ear amputation noted. Eyes: conjunctivae/corneas clear. PERRL.  Sclera anicteric. Throat: lips, mucosa, and tongue normal; without oral thrush or ulcers. Resp: clear to auscultation bilaterally without rhonchi, wheezes or dullness to percussion. Cardio: regular rate and rhythm, S1, S2 normal, no murmur, click, rub or gallop GI: soft, non-tender; bowel sounds normal; no masses,  no organomegaly Skin: Skin color, texture, turgor normal. No rashes or lesions Lymph nodes: Cervical, supraclavicular, and axillary nodes normal. Neurologic: Grossly normal without any motor, sensory or deep tendon reflexes. Musculoskeletal: No joint deformity or effusion.  CBC    Component Value Date/Time   WBC 9.7 06/10/2019 1223   RBC 3.79 (L) 06/10/2019 1223   HGB 12.2 (L) 06/10/2019 1223   HCT 38.3 (L) 06/10/2019 1223   PLT 212 06/10/2019 1223   MCV 101.1 (H) 06/10/2019 1223   MCH 32.2 06/10/2019 1223   MCHC 31.9 06/10/2019 1223   RDW 13.9 06/10/2019 1223     Assessment and Plan:   86 year old with:  1.  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  He underwent excision of the right ear completed on January 7 under the care of Dr. Marla Roe with the final pathology showing 3.8 mm total thickness with the histology showing superficial spreading/epithelioid and spindled melanoma.  There is a 3.4 mm nodule less than 2 cm from the primary melanoma noted.  Final pathological staging was T3BN1.  The natural course of this disease and risk of relapse was discussed at this time the role of adjuvant immunotherapy as well as oral targeted therapy for BRAF was reviewed at this time.  He does represent high risk given his satellite lesion and the depth of his melanoma.  The role of immunotherapy in this particular setting for a 1 year in the form of nivolumab or Pembrolizumab were reviewed.  Complication associated with this therapy was discussed to include fatigue, tiredness, dermatitis as well as immune mediated complications.  These were include pneumonitis, thyroid disease, colitis and arthritis.  Given his age and limited performance status as well as the potential cardiac and pulmonary issues that could be created by immunotherapy active surveillance could be an alternative strategy.  I recommended proceeding with a PET scan for staging purposes and pending these results we will make a determination.  If his PET scan is clear, I recommended proceeding with active surveillance with imaging studies every 6 months for the first 3 years.  He is agreeable with this plan and all his questions were answered to his satisfaction.  2.  Dermatology surveillance: We will continue to follow with dermatology regarding this issue.   3. follow-up: We will be in 6 months sooner if his PET scan is abnormal.   60  minutes was dedicated to this encounter.  The time was spent on reviewing pathology reports, discussing treatment options as well as potential complications related to therapy..   A copy of this consult has been  forwarded to the requesting physician.

## 2019-08-17 ENCOUNTER — Telehealth: Payer: Self-pay | Admitting: Oncology

## 2019-08-17 NOTE — Telephone Encounter (Signed)
Scheduled appt per 2/16 los.  Sent a message to HIM pool to get a calendar mailed out.

## 2019-08-19 DIAGNOSIS — I272 Pulmonary hypertension, unspecified: Secondary | ICD-10-CM | POA: Diagnosis not present

## 2019-08-19 DIAGNOSIS — I350 Nonrheumatic aortic (valve) stenosis: Secondary | ICD-10-CM | POA: Diagnosis not present

## 2019-08-19 DIAGNOSIS — Z79899 Other long term (current) drug therapy: Secondary | ICD-10-CM | POA: Diagnosis not present

## 2019-08-19 DIAGNOSIS — D649 Anemia, unspecified: Secondary | ICD-10-CM | POA: Diagnosis not present

## 2019-08-19 DIAGNOSIS — D692 Other nonthrombocytopenic purpura: Secondary | ICD-10-CM | POA: Diagnosis not present

## 2019-08-19 DIAGNOSIS — N401 Enlarged prostate with lower urinary tract symptoms: Secondary | ICD-10-CM | POA: Diagnosis not present

## 2019-08-19 DIAGNOSIS — J9611 Chronic respiratory failure with hypoxia: Secondary | ICD-10-CM | POA: Diagnosis not present

## 2019-08-19 DIAGNOSIS — J449 Chronic obstructive pulmonary disease, unspecified: Secondary | ICD-10-CM | POA: Diagnosis not present

## 2019-08-29 ENCOUNTER — Other Ambulatory Visit: Payer: Self-pay

## 2019-08-29 ENCOUNTER — Ambulatory Visit (HOSPITAL_COMMUNITY)
Admission: RE | Admit: 2019-08-29 | Discharge: 2019-08-29 | Disposition: A | Discharge status: Home / Self Care | Payer: PPO | Source: Ambulatory Visit | Attending: Oncology | Admitting: Oncology

## 2019-08-29 DIAGNOSIS — Z79899 Other long term (current) drug therapy: Secondary | ICD-10-CM | POA: Insufficient documentation

## 2019-08-29 DIAGNOSIS — J449 Chronic obstructive pulmonary disease, unspecified: Secondary | ICD-10-CM | POA: Diagnosis not present

## 2019-08-29 DIAGNOSIS — J929 Pleural plaque without asbestos: Secondary | ICD-10-CM | POA: Diagnosis not present

## 2019-08-29 DIAGNOSIS — G4734 Idiopathic sleep related nonobstructive alveolar hypoventilation: Secondary | ICD-10-CM | POA: Diagnosis not present

## 2019-08-29 DIAGNOSIS — C439 Malignant melanoma of skin, unspecified: Secondary | ICD-10-CM | POA: Diagnosis not present

## 2019-08-29 DIAGNOSIS — C4321 Malignant melanoma of right ear and external auricular canal: Secondary | ICD-10-CM | POA: Diagnosis not present

## 2019-08-29 LAB — GLUCOSE, CAPILLARY: Glucose-Capillary: 97 mg/dL (ref 70–99)

## 2019-08-29 MED ORDER — FLUDEOXYGLUCOSE F - 18 (FDG) INJECTION
8.5900 | Freq: Once | INTRAVENOUS | Status: AC | PRN
  Administered 2019-08-29: 8.59 via INTRAVENOUS

## 2019-08-30 ENCOUNTER — Other Ambulatory Visit: Payer: Self-pay | Admitting: Oncology

## 2019-08-30 DIAGNOSIS — C439 Malignant melanoma of skin, unspecified: Secondary | ICD-10-CM

## 2019-08-31 ENCOUNTER — Other Ambulatory Visit: Payer: Self-pay | Admitting: Internal Medicine

## 2019-08-31 ENCOUNTER — Encounter (HOSPITAL_COMMUNITY): Payer: Self-pay

## 2019-08-31 NOTE — Progress Notes (Signed)
DD 2  [name] [name] "[name] [name]" Male, 86 y.o., 12-15-1927 MRN:  [id number] Phone:  [phone] (H) PCP:  [name], MD Coverage:  [employer] Ppo Next Appt With Radiology ([facility] 2) 09/07/2019 at 1:00 PM  RE: Biopsy Received: Today Message Contents  Arne Cleveland, MD  Ernestene Mention   [place] FNA R cerv LN   See PET    DDH   Previous Messages  ----- Message -----  From: Lenore Cordia  Sent: 08/31/2019 11:35 AM EST  To: Ir Procedure Requests  Subject: Biopsy                      Procedure Requested: CT Biopsy    Reason for Procedure: Cervical lymphadenopathy    Provider Requesting: Wyatt Portela  Provider Telephone:  [phone]    Other Info: Rad exams in Epic

## 2019-09-07 ENCOUNTER — Other Ambulatory Visit: Payer: Self-pay

## 2019-09-07 ENCOUNTER — Ambulatory Visit (HOSPITAL_COMMUNITY): Payer: PPO

## 2019-09-07 ENCOUNTER — Ambulatory Visit (HOSPITAL_COMMUNITY)
Admission: RE | Admit: 2019-09-07 | Discharge: 2019-09-07 | Disposition: A | Discharge status: Home / Self Care | Payer: PPO | Source: Ambulatory Visit | Attending: Oncology | Admitting: Oncology

## 2019-09-07 ENCOUNTER — Other Ambulatory Visit: Payer: Self-pay | Admitting: Oncology

## 2019-09-07 DIAGNOSIS — C439 Malignant melanoma of skin, unspecified: Secondary | ICD-10-CM

## 2019-09-07 DIAGNOSIS — R590 Localized enlarged lymph nodes: Secondary | ICD-10-CM | POA: Insufficient documentation

## 2019-09-07 DIAGNOSIS — C779 Secondary and unspecified malignant neoplasm of lymph node, unspecified: Secondary | ICD-10-CM | POA: Diagnosis not present

## 2019-09-07 DIAGNOSIS — Z85820 Personal history of malignant melanoma of skin: Secondary | ICD-10-CM | POA: Diagnosis not present

## 2019-09-07 DIAGNOSIS — C801 Malignant (primary) neoplasm, unspecified: Secondary | ICD-10-CM | POA: Diagnosis not present

## 2019-09-07 MED ORDER — LIDOCAINE-EPINEPHRINE (PF) 2 %-1:200000 IJ SOLN
INTRAMUSCULAR | Status: AC
  Filled 2019-09-07: qty 20

## 2019-09-07 NOTE — Procedures (Signed)
Pre Procedure Dx: Cervical lymphadenopathy Post Procedural Dx: Same  Technically successful US guided biopsy of hypermetabolic right cervical lymph node.  EBL: Trace No immediate complications.   Ronny Bacon, MD Pager #: [phone]

## 2019-09-09 LAB — SURGICAL PATHOLOGY

## 2019-09-13 ENCOUNTER — Telehealth: Payer: Self-pay | Admitting: Oncology

## 2019-09-13 DIAGNOSIS — D539 Nutritional anemia, unspecified: Secondary | ICD-10-CM | POA: Diagnosis not present

## 2019-09-13 DIAGNOSIS — D649 Anemia, unspecified: Secondary | ICD-10-CM | POA: Diagnosis not present

## 2019-09-13 NOTE — Telephone Encounter (Signed)
Called pt per 3/15 sch message - pt aware of appt added for  3/17

## 2019-09-14 ENCOUNTER — Inpatient Hospital Stay: Payer: PPO | Attending: Oncology | Admitting: Oncology

## 2019-09-14 ENCOUNTER — Telehealth: Payer: Self-pay | Admitting: Oncology

## 2019-09-14 DIAGNOSIS — C439 Malignant melanoma of skin, unspecified: Secondary | ICD-10-CM

## 2019-09-14 NOTE — Telephone Encounter (Signed)
No los per 3/17. °

## 2019-09-14 NOTE — Progress Notes (Signed)
Hematology and Oncology Follow Up for Telemedicine Visits  [name] [name] [id number] 1928-03-17 86 y.o. 09/14/2019 8:02 AM [name], MDStoneking, Christiane Ha, MD   I connected with [name] [name] on 09/14/19 at  8:30 AM EDT by telephone visit and verified that I am speaking with the correct person using two identifiers.   I discussed the limitations, risks, security and privacy concerns of performing an evaluation and management service by telemedicine and the availability of in-person appointments. I also discussed with the patient that there may be a patient responsible charge related to this service. The patient expressed understanding and agreed to proceed.  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  He was found to have a satellite nodule less than 2 cm from the primary melanoma.   Prior Therapy:  He is status post FNA of a right cervical lymph node in the care of Dr. Constance Holster which did not show any evidence of melanoma. He is status post right ear excision completed on July 07, 2019 care of Dr. Marla Roe. He is status post ultrasound-guided biopsy of the right cervical lymph node completed on September 07, 2019.  The pathology showed oncocytic salivary gland tumor.   Current therapy: Under evaluation for possible treatment.  Interim History: [name] reports feeling reasonably well without any major complaints.  He is reporting more swelling on the right side of his neck at this time where the biopsy was performed.  Denies any pain or discomfort.  He denies any anorexia or weight loss.     Medications: I have reviewed the patient's current medications.  Current Outpatient Medications  Medication Sig Dispense Refill  . albuterol (PROAIR HFA) 108 (90 Base) MCG/ACT  inhaler 2 puffs 4 times a day as needed (Patient taking differently: Inhale 2 puffs into the lungs every 4 (four) hours as needed for wheezing or shortness of breath. 2 puffs 4 times a day as needed) 8 g 12  . albuterol (PROVENTIL) (2.5 MG/3ML) 0.083% nebulizer solution USE ONE VIAL VIA NEBULIZATION BY MOUTH DAILY OR TWICE A DAY (Patient taking differently: Take 2.5 mg by nebulization 2 (two) times daily as needed for wheezing or shortness of breath. ) 150 mL 1  . ALPRAZolam (XANAX) 1 MG tablet Take 1 mg by mouth at bedtime.     Marland Kitchen CALCIUM-VITAMIN D PO Take 1 tablet by mouth daily.    . Cholecalciferol (VITAMIN D) 50 MCG (2000 UT) CAPS Take 2,000 Units by mouth daily.    . furosemide (LASIX) 20 MG tablet Take 20 mg by mouth daily as needed for edema.     Marland Kitchen ipratropium (ATROVENT) 0.02 % nebulizer solution USE 1 VIAL VIA NEBULIZER EVERY 8 HOURS AS NEEDED FOR WHEEZING AND FOR SHORTNESS OF BREATH 75 mL 1  . ketoconazole (NIZORAL) 2 % cream Apply 1 application topically daily as needed for irritation. Reported on 07/30/2015    . predniSONE (DELTASONE) 5 MG tablet TAKE TWO OR THREE TABLETS BY MOUTH DAILY OR AS DIRECTED (Patient taking differently: Take 10 mg by mouth daily with breakfast. TAKE TWO OR THREE TABLETS BY MOUTH DAILY OR AS DIRECTED) 270 tablet 2  . SYMBICORT 160-4.5 MCG/ACT inhaler Inhale 1 puff into the lungs 2 (two) times daily.     Marland Kitchen terazosin (HYTRIN) 5 MG capsule Take 5 mg by mouth  at bedtime. Once a day     . traMADol (ULTRAM) 50 MG tablet Take 50 mg by mouth every 6 (six) hours as needed (pain).     . triamcinolone cream (KENALOG) 0.1 % Apply 1 application topically 2 (two) times daily as needed (irritation).     . vitamin B-12 (CYANOCOBALAMIN) 1000 MCG tablet Take 1,000 mcg by mouth daily.     No current facility-administered medications for this visit.     Allergies:  Allergies  Allergen Reactions  . Fenofibrate     Other reaction(s): pancreatitis  . Zocor  [Simvastatin]      Other reaction(s): muscle weakness       Lab Results: Lab Results  Component Value Date   WBC 9.7 06/10/2019   HGB 12.2 (L) 06/10/2019   HCT 38.3 (L) 06/10/2019   MCV 101.1 (H) 06/10/2019   PLT 212 06/10/2019   IMPRESSION: 1. Intensely Hypermetabolic RIGHT level II cervical lymph node consistent locoregional metastatic melanoma. 2. Mild metabolic activity associated with ill-defined LEFT level II lymph node is indeterminate. Favor benign activity. 3. No evidence of distant metastatic melanoma. 4. Bilateral calcified pleural plaques   Impression and Plan:  86 year old with:  1.    T3BN1 superficial spreading/epithelioid and spindle melanoma of the right ear diagnosed in November 2020.    He status post surgical resection with excision of his right ear completed in January 2021.  PET CT scan obtained on August 29, 2019 was reviewed today and did not show any evidence of metastatic disease.  It did show activity noted in the right cervical lymph node that is suspicious for metastatic disease.  This is biopsy-proven to be salivary gland tumor and not melanoma.  For the time being I recommended continued active surveillance regarding his melanoma.  Repeat evaluation and imaging studies in 6 months.    2.    Salivary gland tumor: Management of these tumors are normally surgical but he would be high risk for anesthesia at this time.  His case will be discussed with Dr. Constance Holster as well as multidisciplinary tumor board to discuss treatment options including radiation therapy as an alternative if surgery cannot be done safely.  There is no role for systemic therapy at this time.   3. follow-up: To be determined pending his evaluation regarding his salivary gland tumor.    I discussed the assessment and treatment plan with the patient. The patient was provided an opportunity to ask questions and all were answered. The patient agreed with the plan and demonstrated an understanding of  the instructions.   The patient was advised to call back or seek an in-person evaluation if the symptoms worsen or if the condition fails to improve as anticipated.  I provided 20 minutes of non face-to-face telephone visit time during this encounter.  The time was dedicated to reviewing imaging studies, pathology reports, treatment options and future plan of care discussion.  Zola Button, MD 09/14/2019 8:02 AM

## 2019-09-16 ENCOUNTER — Ambulatory Visit: Payer: PPO | Admitting: Plastic Surgery

## 2019-09-29 DIAGNOSIS — G4734 Idiopathic sleep related nonobstructive alveolar hypoventilation: Secondary | ICD-10-CM | POA: Diagnosis not present

## 2019-09-29 DIAGNOSIS — J449 Chronic obstructive pulmonary disease, unspecified: Secondary | ICD-10-CM | POA: Diagnosis not present

## 2019-10-24 ENCOUNTER — Other Ambulatory Visit: Payer: Self-pay | Admitting: Internal Medicine

## 2019-10-25 DIAGNOSIS — E7800 Pure hypercholesterolemia, unspecified: Secondary | ICD-10-CM | POA: Diagnosis not present

## 2019-10-25 DIAGNOSIS — D649 Anemia, unspecified: Secondary | ICD-10-CM | POA: Diagnosis not present

## 2019-11-04 DIAGNOSIS — S0501XA Injury of conjunctiva and corneal abrasion without foreign body, right eye, initial encounter: Secondary | ICD-10-CM | POA: Diagnosis not present

## 2019-11-07 DIAGNOSIS — S0502XD Injury of conjunctiva and corneal abrasion without foreign body, left eye, subsequent encounter: Secondary | ICD-10-CM | POA: Diagnosis not present

## 2019-11-08 DIAGNOSIS — G894 Chronic pain syndrome: Secondary | ICD-10-CM | POA: Diagnosis not present

## 2019-11-08 DIAGNOSIS — J9611 Chronic respiratory failure with hypoxia: Secondary | ICD-10-CM | POA: Diagnosis not present

## 2019-11-08 DIAGNOSIS — J449 Chronic obstructive pulmonary disease, unspecified: Secondary | ICD-10-CM | POA: Diagnosis not present

## 2019-11-08 DIAGNOSIS — M5441 Lumbago with sciatica, right side: Secondary | ICD-10-CM | POA: Diagnosis not present

## 2019-11-08 DIAGNOSIS — D649 Anemia, unspecified: Secondary | ICD-10-CM | POA: Diagnosis not present

## 2019-11-08 DIAGNOSIS — I272 Pulmonary hypertension, unspecified: Secondary | ICD-10-CM | POA: Diagnosis not present

## 2019-11-08 DIAGNOSIS — E7800 Pure hypercholesterolemia, unspecified: Secondary | ICD-10-CM | POA: Diagnosis not present

## 2019-11-14 ENCOUNTER — Encounter: Payer: Self-pay | Admitting: Physical Medicine and Rehabilitation

## 2019-11-15 ENCOUNTER — Telehealth: Payer: Self-pay | Admitting: Nurse Practitioner

## 2019-11-15 DIAGNOSIS — I350 Nonrheumatic aortic (valve) stenosis: Secondary | ICD-10-CM

## 2019-11-15 NOTE — Telephone Encounter (Signed)
Left message for patient to call back to discuss scheduling of office visit and echo in August for evaluation of his aortic stenosis.

## 2019-11-23 ENCOUNTER — Telehealth: Payer: Self-pay | Admitting: Internal Medicine

## 2019-11-23 NOTE — Telephone Encounter (Signed)
[facility] to see exactly what they need. LMTCB

## 2019-11-24 NOTE — Telephone Encounter (Signed)
Called and spoke with [name] [name] from [employer]. She states that [name] [name] needs a sooner appointment than 0000000 to re-certify for his oxygen. Called and spoke with [name] [name] he wanted it to be an in person visit so he has been rescheduled to 6/25.   Dr. Annamaria Boots per [facility] your notes for that appointment have to say something about [name] [name] needs oxygen and continues to benefit from it.  Nothing further needed at this time.

## 2019-11-24 NOTE — Telephone Encounter (Signed)
Spoke with patient regarding scheduling office visit with Dr. Acie Fredrickson. He is due for follow-up in June. He requests to have repeat echo prior to office visit and requests that we call back next week to schedule both appointments.

## 2019-11-24 NOTE — Telephone Encounter (Signed)
Sent a community message to Clemmie Krill and [place] with [employer] to check on what's needed. [employer] bought out [facility].

## 2019-11-24 NOTE — Telephone Encounter (Signed)
Lorraine Lax, CMA; Skeet Latch; Sheila Oats, Delaney Meigs, Melissa    Hello,   Yes ma'am that is correct. The patient needs to have another face to face re-evaluation.   Thank you!

## 2019-11-29 NOTE — Addendum Note (Signed)
Addended by: Emmaline Life on: 11/29/2019 08:52 AM   Modules accepted: Orders

## 2019-12-08 ENCOUNTER — Encounter: Payer: Self-pay | Admitting: Physical Medicine and Rehabilitation

## 2019-12-08 ENCOUNTER — Encounter: Payer: PPO | Attending: Physical Medicine and Rehabilitation | Admitting: Physical Medicine and Rehabilitation

## 2019-12-08 ENCOUNTER — Other Ambulatory Visit: Payer: Self-pay

## 2019-12-08 VITALS — BP 100/61 | HR 84 | Temp 97.0°F | Ht 68.5 in | Wt 154.0 lb

## 2019-12-08 DIAGNOSIS — G4701 Insomnia due to medical condition: Secondary | ICD-10-CM | POA: Insufficient documentation

## 2019-12-08 DIAGNOSIS — M48062 Spinal stenosis, lumbar region with neurogenic claudication: Secondary | ICD-10-CM | POA: Insufficient documentation

## 2019-12-08 MED ORDER — AMITRIPTYLINE HCL 10 MG PO TABS: 10.0000 mg | ORAL_TABLET | Freq: Every day | ORAL | 3 refills | Status: DC

## 2019-12-08 NOTE — Progress Notes (Signed)
Subjective:    Patient ID: [name] [name], male    DOB: 01-29-1928, 86 y.o.   MRN: [id number]  HPI  [name] [name] has COPD, spinal stenosis, peripheral neuropathy, aortic stenosis.   For his spinal stenosis, he tried epidural steroid injection without any benefit. He did some exercises but no formal therapy. He is not sure at which level he received the injection.   His pain is worse with standing and walking.   The pain goes down the lateral side of his right leg.   He has spinal stenosis on imaging, MRI reviewed with patient and his wife.   He is accompanied by his wife today. They have been married for 70 years! He has a granddaughter who goes to [place].   He has also been sleeping poorly. He does not feel that this is related to his pain.   He has been recommended to have a TAVR for his aortic stenosis but is fearful of the risk associated with the procedure.   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  He asks about spinal cord stimulator and nerve ablation as treatment options.    Pain Inventory Average Pain 4 Pain Right Now 9 My pain is intermittent, sharp, stabbing and aching  In the last 24 hours, has pain interfered with the following? General activity 8 Relation with others 0 Enjoyment of life 7 What TIME of day is your pain at its worst? all the time Sleep (in general) Poor  Pain is worse with: standing Pain improves with: medication Relief from Meds: 4  Mobility walk with assistance use a walker how many minutes can you walk? 91min ability to climb steps?  no  Function I need assistance with the following:  household duties and house duties  Neuro/Psych bladder control problems weakness trouble walking  Prior Studies Any changes since last visit?  yes x-rays CT/MRI  Physicians involved in your care New patient   Family History  Problem Relation Age of Onset  . Heart disease Father   . Alzheimer's disease Mother   . Heart  disease Paternal Uncle    Social History   Socioeconomic History  . Marital status: Married    Spouse name: Not on file  . Number of children: Not on file  . Years of education: Not on file  . Highest education level: Not on file  Occupational History  . Occupation: Armed forces training and education officer  Tobacco Use  . Smoking status: Former Smoker    Packs/day: 0.50    Years: 20.00    Pack years: 10.00    Types: Cigarettes    Quit date: 06/30/1973    Years since quitting: 46.4  . Smokeless tobacco: Never Used  Vaping Use  . Vaping Use: Never used  Substance and Sexual Activity  . Alcohol use: No    Alcohol/week: 0.0 standard drinks  . Drug use: No  . Sexual activity: Not on file  Other Topics Concern  . Not on file  Social History Narrative  . Not on file   Social Determinants of Health   Financial Resource Strain:   . Difficulty of Paying Living Expenses:   Food Insecurity:   . Worried About Charity fundraiser in the Last Year:   . Arboriculturist in the Last Year:   Transportation Needs:   . Film/video editor (Medical):   Marland Kitchen Lack of Transportation (Non-Medical):   Physical Activity:   . Days of Exercise per Week:   .  Minutes of Exercise per Session:   Stress:   . Feeling of Stress :   Social Connections:   . Frequency of Communication with Friends and Family:   . Frequency of Social Gatherings with Friends and Family:   . Attends Religious Services:   . Active Member of Clubs or Organizations:   . Attends Archivist Meetings:   Marland Kitchen Marital Status:    Past Surgical History:  Procedure Laterality Date  . HERNIA REPAIR    . TONSILLECTOMY     Past Medical History:  Diagnosis Date  . Adrenal insufficiency ([place])   . Allergic rhinitis   . Aortic stenosis, moderate   . Asthma   . Chronic sinusitis   . COPD (chronic obstructive pulmonary disease) ([place])   . H/O: asbestos exposure   . Nasal polyps   . PAC (premature atrial contraction)    EKG 12/03/09   BP 100/61    Pulse 84   Temp (!) 97 F (36.1 C)   Ht 5' 8.5" (1.74 m)   Wt 154 lb (69.9 kg)   SpO2 95%   BMI 23.08 kg/m   Opioid Risk Score:   Fall Risk Score:  `1  Depression screen PHQ 2/9  No flowsheet data found. Review of Systems  Constitutional: Negative.   HENT: Negative.   Eyes: Negative.   Respiratory: Positive for shortness of breath.   Cardiovascular:       Acrotic stenosis  Gastrointestinal: Negative.   Endocrine: Negative.   Genitourinary:       BPH  Musculoskeletal: Positive for back pain.  Skin: Positive for rash.  Neurological: Positive for numbness.       FEET NUMBNESS  Hematological: Negative.   Psychiatric/Behavioral: Negative.        Objective:   Physical Exam Gen: no distress, normal appearing HEENT: oral mucosa pink and moist, right ear partial amputation.   NCAT Cardio: Reg rate Chest: normal effort, normal rate of breathing Abd: soft, non-distended Ext: no edema Skin: intact Neuro: AOx3 Musculoskeletal: 5/5 strength throughout. Thoracic kyphosis. Antalgic gait with RW.  Psych: pleasant, normal affect    Assessment & Plan:  Mr. [name] [name] is an 86 year old man who presents to establish care for spinal stenosis with neurogenic claudication of right L5 nerve root. Also has moderate facet hypertrophy of lumbar spine. Also has insomnia.   -MRI reviewed and above results discussed with patient and wife.  -Physical therapy for core and paraspinal muscle strengthening, balance, and HEP  -Amitriptyline 10mg  HS to help with neuropathic pain and insomnia. Wife to clear with cardiologist first given aortic stenosis.  -Discussed potential treatment options: PT, heating pads, medications, injections, surgery. Recommend conservative treatment at this time.   -All questions answered. RTC in 1 month to assess progress with above treatment.   -

## 2019-12-14 ENCOUNTER — Ambulatory Visit: Payer: PPO | Admitting: Physical Medicine and Rehabilitation

## 2019-12-15 ENCOUNTER — Ambulatory Visit: Payer: PPO | Admitting: Internal Medicine

## 2019-12-16 ENCOUNTER — Ambulatory Visit (HOSPITAL_COMMUNITY): Payer: PPO | Attending: Cardiology

## 2019-12-16 ENCOUNTER — Other Ambulatory Visit: Payer: Self-pay

## 2019-12-16 DIAGNOSIS — I350 Nonrheumatic aortic (valve) stenosis: Secondary | ICD-10-CM | POA: Diagnosis not present

## 2019-12-19 ENCOUNTER — Encounter: Payer: Self-pay | Admitting: Cardiovascular Disease

## 2019-12-19 ENCOUNTER — Ambulatory Visit: Payer: PPO | Admitting: Cardiovascular Disease

## 2019-12-19 ENCOUNTER — Other Ambulatory Visit: Payer: Self-pay

## 2019-12-19 VITALS — BP 98/62 | HR 84 | Ht 68.5 in | Wt 154.0 lb

## 2019-12-19 DIAGNOSIS — I350 Nonrheumatic aortic (valve) stenosis: Secondary | ICD-10-CM

## 2019-12-19 DIAGNOSIS — I2510 Atherosclerotic heart disease of native coronary artery without angina pectoris: Secondary | ICD-10-CM

## 2019-12-19 MED ORDER — ASPIRIN EC 81 MG PO TBEC
81.0000 mg | DELAYED_RELEASE_TABLET | Freq: Every day | ORAL | 11 refills | Status: AC
Start: 2019-12-19 — End: ?

## 2019-12-19 NOTE — Patient Instructions (Addendum)
Medication Instructions:  Your physician recommends that you continue on your current medications as directed. Please refer to the Current Medication list given to you today.  *If you need a refill on your cardiac medications before your next appointment, please call your pharmacy*   Lab Work: None Ordered If you have labs (blood work) drawn today and your tests are completely normal, you will receive your results only by: Marland Kitchen MyChart Message (if you have MyChart) OR . A paper copy in the mail If you have any lab test that is abnormal or we need to change your treatment, we will call you to review the results.   Testing/Procedures: None Ordered   Follow-Up: At [facility], you and your health needs are our priority.  As part of our continuing mission to provide you with exceptional heart care, we have created designated Provider Care Teams.  These Care Teams include your primary Cardiologist (physician) and Advanced Practice Providers (APPs -  Physician Assistants and Nurse Practitioners) who all work together to provide you with the care you need, when you need it.   Your next appointment:   6 month(s) on Monday Dec. 13 at 2:00 pm with Dr. Acie Fredrickson  The format for your next appointment:   In Person  Provider:   You may see Mertie Moores, MD or one of the following Advanced Practice Providers on your designated Care Team:    Richardson Dopp, PA-C  [place], [place]

## 2019-12-19 NOTE — Progress Notes (Signed)
Patient ID: [name] [name], male   DOB: 08-25-1927, 86 y.o.   MRN: [id number]     Patient ID: [name] [name] MRN: [id number] DOB/AGE: 06-07-1928 86 y.o.   Referring Physician Dr. Felipa Eth  Problem List: 1. Coronary artery disease-status post remote stenting 2. Moderate aortic stenosis 3. Premature atrial contractions 4. Point hypertension-moderate    Previous notes from Dr. Irish Lack Reason for Consultation fatigue, CAD, palpitations  HPI: 86 y/o with COPD.  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  His activity is limited from lower extremity neuropathy.  He had a stent many years ago.  He followed with Dr. Leonia Reeves until a few years ago.  I   He has had more fatigue of late. His activity is decreasing due to his shortness of breath. Because of this, he would like to have a cardiac evaluation. Has been several years since he saw Dr. Leonia Reeves. Prior to his stent, he had some decreased exertion as well. No chest pain.   Recent echo in 3/16 showed normal LV function and moderate aortic stenosis.  Neuropathy is limiting exercise.   In the morning, he feels that his HR is irregular and elevated.  He will bear down and sx get better.    Oct 30 2016:   Transfer from Dr. Irish Lack Retired from [employer]  ([place])  Has palpitations ,   No hx of syncope.   No CP or dyspnea Still does his yard work.   Went to the gym until recently .   Has some leg issues.   March 15, 2018: [name] [name] is seen back today  Has COPD - is not more short of breath compared to usual He had an echocardiogram in August that revealed that his aortic valve gradient had now increased to a mean of 40.  This is up from last year which revealed a mean aortic valve gradient of 30 mmHg.  He denies any syncope or chest pain.  He has some baseline shortness of breath because of his COPD but is no more short of breath any used to be.  Still fairly active. Has spinal stenosis .     Aug. 6, 2020  [name] [name] is seen today for follow up of his aortic stenosis. Has severe AS.    Has spinal stensis,  Uses a walker  Or cane to walk   June 02, 2019: Mr. [name] [name] is seen today for follow-up of his aortic stenosis.  He has severe aortic stenosis and has been seen by the TAVR team.  Dr. Burt Knack was in favor of proceeding  with TAVR .  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  Has seen plastics and ENT.  Going to have an U/S on Monday   December 19, 2019:  [name] [name] is seen today for follow up of his aortic stenosis. He had an echo last week which showed severe AS with mean AV gradient of 57 mm Hg. Previous echo in Aug. 2020 showed a mean gradient of 37.   He has moderate MS with mean gradient of 6 mm Hg. He has been seen by Dr. Burt Knack and was thought to be a good candidate for TAVR but was then diagnosed witih melanoma and his TAVR work up was put on hold  Has had a right ear resection .  No further evidence of melanoma mets.   He is still having questons about TAVR .  Is unsure if he really needs to have TAVR .  We discussed the natural history of untreated aortic stenosis.  He notes that he has not had any episodes of angina certainly has not had any syncope or congestive heart failure.      Current Outpatient Medications  Medication Sig Dispense Refill  . albuterol (PROAIR HFA) 108 (90 Base) MCG/ACT inhaler 2 puffs 4 times a day as needed 8 g 12  . albuterol (PROVENTIL) (2.5 MG/3ML) 0.083% nebulizer solution USE ONE VIAL VIA NEBULIZATION BY MOUTH DAILY OR TWICE A DAY 150 mL 1  . ALPRAZolam (XANAX) 1 MG tablet Take 1 mg by mouth at bedtime.     Marland Kitchen aspirin 325 MG tablet Take 325 mg by mouth daily.    . calcium gluconate 500 MG tablet Take 1 tablet by mouth 3 (three) times daily.    Marland Kitchen CALCIUM-VITAMIN D PO Take 1 tablet by mouth daily.    . Cholecalciferol (VITAMIN D) 50 MCG (2000 UT) CAPS Take 2,000 Units by mouth daily.    . furosemide (LASIX) 20 MG tablet  Take 20 mg by mouth daily as needed for edema.     Marland Kitchen ipratropium (ATROVENT) 0.02 % nebulizer solution INHALE 1 VIAL BY MOUTH VIA NEBULIZER EVERY 8 HOURS AS NEEDED FOR WHEEZING AND FOR SHORTNESS OF BREATH 75 mL 11  . ketoconazole (NIZORAL) 2 % cream Apply 1 application topically daily as needed for irritation. Reported on 07/30/2015    . nitroGLYCERIN (NITROSTAT) 0.4 MG SL tablet Place 0.4 mg under the tongue at bedtime.    Marland Kitchen ofloxacin (OCUFLOX) 0.3 % ophthalmic solution Place 1 drop into both eyes as needed.    . OXYGEN Inhale into the lungs.    . predniSONE (DELTASONE) 5 MG tablet TAKE TWO OR THREE TABLETS BY MOUTH DAILY OR AS DIRECTED 270 tablet 2  . SYMBICORT 160-4.5 MCG/ACT inhaler Inhale 1 puff into the lungs 2 (two) times daily.     Marland Kitchen terazosin (HYTRIN) 5 MG capsule Take 5 mg by mouth at bedtime. Once a day     . traMADol (ULTRAM) 50 MG tablet Take 50 mg by mouth every 6 (six) hours as needed (pain).     . triamcinolone cream (KENALOG) 0.1 % Apply 1 application topically 2 (two) times daily as needed (irritation).     . vitamin B-12 (CYANOCOBALAMIN) 1000 MCG tablet Take 1,000 mcg by mouth daily.     No current facility-administered medications for this visit.   Past Medical History:  Diagnosis Date  . Adrenal insufficiency ([place])   . Allergic rhinitis   . Aortic stenosis, moderate   . Asthma   . Chronic sinusitis   . COPD (chronic obstructive pulmonary disease) ([place])   . H/O: asbestos exposure   . Nasal polyps   . PAC (premature atrial contraction)    EKG 12/03/09    Family History  Problem Relation Age of Onset  . Heart disease Father   . Alzheimer's disease Mother   . Heart disease Paternal Uncle     Social History   Socioeconomic History  . Marital status: Married    Spouse name: Not on file  . Number of children: Not on file  . Years of education: Not on file  . Highest education level: Not on file  Occupational History  . Occupation: Armed forces training and education officer  Tobacco Use  .  Smoking status: Former Smoker    Packs/day: 0.50    Years: 20.00    Pack years: 10.00    Types: Cigarettes    Quit date: 06/30/1973  Years since quitting: 46.5  . Smokeless tobacco: Never Used  Vaping Use  . Vaping Use: Never used  Substance and Sexual Activity  . Alcohol use: No    Alcohol/week: 0.0 standard drinks  . Drug use: No  . Sexual activity: Not on file  Other Topics Concern  . Not on file  Social History Narrative  . Not on file   Social Determinants of Health   Financial Resource Strain:   . Difficulty of Paying Living Expenses:   Food Insecurity:   . Worried About Charity fundraiser in the Last Year:   . Arboriculturist in the Last Year:   Transportation Needs:   . Film/video editor (Medical):   Marland Kitchen Lack of Transportation (Non-Medical):   Physical Activity:   . Days of Exercise per Week:   . Minutes of Exercise per Session:   Stress:   . Feeling of Stress :   Social Connections:   . Frequency of Communication with Friends and Family:   . Frequency of Social Gatherings with Friends and Family:   . Attends Religious Services:   . Active Member of Clubs or Organizations:   . Attends Archivist Meetings:   Marland Kitchen Marital Status:   Intimate Partner Violence:   . Fear of Current or Ex-Partner:   . Emotionally Abused:   Marland Kitchen Physically Abused:   . Sexually Abused:     Past Surgical History:  Procedure Laterality Date  . HERNIA REPAIR    . TONSILLECTOMY        Review of systems complete and found to be negative unless listed above .  No nausea, vomiting.  No fever chills, No focal weakness,  No palpitations.   Physical Exam: Blood pressure 98/62, pulse 84, height 5' 8.5" (1.74 m), weight 154 lb (69.9 kg), SpO2 97 %.  GEN:   Elderly male,  NAD  HEENT: Normal NECK: No JVD; No carotid bruits LYMPHATICS: No lymphadenopathy CARDIAC: RR, with premature beats,  3/6 systolic murmur  RESPIRATORY:  Clear to auscultation without rales, wheezing or  rhonchi  ABDOMEN: Soft, non-tender, non-distended MUSCULOSKELETAL:  No edema; No deformity  SKIN: Warm and dry NEUROLOGIC:  Alert and oriented x 3   Labs:   EKG:   December 19, 2019: Normal sinus rhythm at 84.  No ST or T wave changes.   ASSESSMENT AND PLAN:  1.  Coronary artery disease:  No angina    2.  Aortic stenosis - .  He has been seen by the TAVR team and was thought to be a reasonable candidate for TAVR.  When I saw him 6 months ago he had just found out that he had melanoma.  It was originally thought that he might have some metastatic melanoma.  He has had resection of his right ear and there is been no evidence of metastatic disease.  His mean AV gradient has increased from 37 to 57 mmHG .   Despite this , he has not had any symptoms. - no CP, syncope, or CHF symptoms   I have told him he is a good candidate and that he should proceed with TAVR work up .  He is concerned about possible risks during the procedure and wants to wait.   There is a chance that they will be moving to [place], [place] to live near their son .  I have asked him to continue to consider TAVR.  I will see him in 6 months for follow-up  visit.  If he has moved to [place] by the time we will forward our records at his request.      Mertie Moores, MD  12/19/2019 5:10 PM    [address],  [place], [place]  [address] Pager [phone] Phone: [phone]; Fax: [phone]

## 2019-12-23 ENCOUNTER — Encounter: Payer: Self-pay | Admitting: Internal Medicine

## 2019-12-23 ENCOUNTER — Ambulatory Visit: Payer: PPO | Admitting: Internal Medicine

## 2019-12-23 ENCOUNTER — Other Ambulatory Visit: Payer: Self-pay

## 2019-12-23 VITALS — BP 110/62 | HR 88 | Temp 97.7°F | Ht 69.0 in | Wt 156.6 lb

## 2019-12-23 DIAGNOSIS — J449 Chronic obstructive pulmonary disease, unspecified: Secondary | ICD-10-CM

## 2019-12-23 DIAGNOSIS — J9611 Chronic respiratory failure with hypoxia: Secondary | ICD-10-CM | POA: Diagnosis not present

## 2019-12-23 DIAGNOSIS — J4489 Other specified chronic obstructive pulmonary disease: Secondary | ICD-10-CM

## 2019-12-23 MED ORDER — BREZTRI AEROSPHERE 160-9-4.8 MCG/ACT IN AERO
2.0000 | INHALATION_SPRAY | Freq: Two times a day (BID) | RESPIRATORY_TRACT | 0 refills | Status: DC
Start: 2019-12-23 — End: 2020-06-06

## 2019-12-23 MED ORDER — PREDNISONE 5 MG PO TABS: ORAL_TABLET | ORAL | 2 refills | Status: DC

## 2019-12-23 NOTE — Patient Instructions (Signed)
Order- schedule PFT    Dx chronic obstructive asthma  You benefit from oxygen at night and should continue to use it.  Sample x 2 Breztri inhaler    Inhale 2 puffs, then rinse mouth, twice daily.  If this helps your breathing better than Symbicort, and if it doesn't bother your prostate, then let us know and we can send a prescription.  Please call if we can help

## 2019-12-23 NOTE — Progress Notes (Signed)
Subjective:    Patient ID: [name] [name], male    DOB: 29-Oct-1927, 86 y.o.   MRN: [id number]  HPI M former smoker followed for chronic obstructive asthma, Allergic rhintiis,  COPD, suspected asbestos exposure after working in [employer]. CAD, PHTN 6MWT 02/16/15- 99%, 98%, 98%, 555 feet, no oxygen limitation. Walked at slow pace with an unsteady gait Overnight oximetry 02/21/2015 did qualify for sleep O2 EchoCardiogram 10/21/16-moderate pulmonary hypertension  (67 S) ---------------------------------------------------------------------------------------   07/26/19- 86 year old male former smoker followed for chronic obstructive asthma/steroid dependent, allergic rhinitis, nasal polyps, suspected asbestos exposure after working in [employer], complicated by history SVT/palpitation/ CAD, moderate aortic stenosis, PHTN adrenal insufficiency, chronic leg and back pain, Melanoma resected R ear,  O2 2L/ sleep/no DME-he owns his concentrator. He is still concerned about risks of anesthesia and TAVR surgery for his AS. Breathing stable with little cough. O2 concentrator wearing out. Meds work well.  Has trouble keeping mask on since large portion of R external ear resected for melanoma.  CXR 03/23/2019- No active cardiopulmonary disease.  12/23/19- 86 year old male former smoker followed for chronic obstructive asthma/steroid dependent, allergic rhinitis, nasal polyps, suspected asbestos exposure after working in [employer], complicated by history SVT/palpitation/ CAD, moderate aortic stenosis, PHTN adrenal insufficiency, chronic leg and back pain, Melanoma resected R ear, Spina; Stenosis/Peripheral Neuropathy,  O2 2L/ sleep/no DME-he owns his concentratorbut needs replaced. Dr Acie Fredrickson follows for Cardiology- has recommended TAVR but pt still reluctant.  Had 2 Phizer Covax Denies cough or wheeze. Denies angina or syncope. Also dealing with BPH- discussed trial of sample med with  [facility].  ROS-see HPI   + = positive Constitutional:   + weight loss, night sweats, fevers, chills, fatigue, lassitude. HEENT:   + headaches, difficulty swallowing, tooth/dental problems, sore throat,       No-  sneezing, itching, ear ache, +nasal congestion, post nasal drip,  CV:  No-   chest pain, orthopnea, PND, swelling in lower extremities, anasarca,  dizziness, +palpitations Resp: + shortness of breath with exertion or at rest.              No-   productive cough,  No non-productive cough,  No- coughing up of blood.              No-   change in color of mucus.  + wheezing.   Skin: No-   rash or lesions. GI:  No-   heartburn, indigestion, abdominal pain, nausea, vomiting, GU:  MS:  + joint pain or swelling.  Neuro-     +chronic paresthesias and weakness in lower extremities Psych:  No- change in mood or affect. No depression or anxiety.  No memory loss.  OBJ General- Alert, Oriented, Affect-appropriate, Distress- none acute,  Skin- rash-none, lesions- none, excoriation- none Lymphadenopathy- none Head- atraumatic            Eyes- Gross vision intact, PERRLA, conjunctivae clear secretions            Ears- Hearing, canals-normal, + partial resection R auricle            Nose- + mild turbinate edema, polyps-none seen this visit                        no-Septal dev, mucus,  erosion, perforation             Throat- Mallampati II , mucosa clear , drainage- none, tonsils- atrophic Neck- flexible , trachea midline, no stridor , thyroid  nl, carotid no bruit Chest - symmetrical excursion , unlabored           Heart/CV- ,+ Irregular with frequent extrasystoles, +1-2/4 systolic murmur , no gallop  ,                            - JVD- none , edema- none, stasis changes- none, varices- none           Lung- +diminished but clear to P&A, slow expiratory flow,  wheeze- none, cough- none , dullness-none, rub- none           Chest wall-  Abd-  Br/ Gen/ Rectal- Not done, not indicated Extrem-  cyanosis- none, clubbing, none, atrophy- none, strength- nl, + cane Neuro- +tremor hands

## 2019-12-29 ENCOUNTER — Encounter: Payer: Self-pay | Admitting: Internal Medicine

## 2019-12-29 DIAGNOSIS — J9611 Chronic respiratory failure with hypoxia: Secondary | ICD-10-CM | POA: Insufficient documentation

## 2019-12-29 NOTE — Assessment & Plan Note (Signed)
We are trying to keep him going, recognizing his aortic stenosis and COPD both contribute to DOE, along with his peripheral neuropathy/ Plan- trial of Breztri, to see if this helps DOE with impacting BPH symptoms. Schedule PFT, continue maintenance prednisone.

## 2019-12-29 NOTE — Assessment & Plan Note (Signed)
He continues to need and benefit from O2 during sleep. Plan- [employer] replace old concentrator when eligible.

## 2020-01-06 ENCOUNTER — Encounter: Payer: PPO | Attending: Physical Medicine and Rehabilitation | Admitting: Physical Medicine and Rehabilitation

## 2020-01-06 DIAGNOSIS — M48062 Spinal stenosis, lumbar region with neurogenic claudication: Secondary | ICD-10-CM | POA: Insufficient documentation

## 2020-01-06 DIAGNOSIS — G4701 Insomnia due to medical condition: Secondary | ICD-10-CM | POA: Insufficient documentation

## 2020-01-23 ENCOUNTER — Ambulatory Visit: Payer: PPO | Admitting: Internal Medicine

## 2020-01-24 ENCOUNTER — Other Ambulatory Visit: Payer: Self-pay | Admitting: Internal Medicine

## 2020-01-31 ENCOUNTER — Ambulatory Visit (INDEPENDENT_AMBULATORY_CARE_PROVIDER_SITE_OTHER): Payer: PPO | Admitting: Internal Medicine

## 2020-01-31 ENCOUNTER — Other Ambulatory Visit: Payer: Self-pay

## 2020-01-31 DIAGNOSIS — J449 Chronic obstructive pulmonary disease, unspecified: Secondary | ICD-10-CM

## 2020-01-31 LAB — PULMONARY FUNCTION TEST
DL/VA % pred: 95 %
DL/VA: 3.66 ml/min/mmHg/L
DLCO cor % pred: 101 %
DLCO cor: 21.43 ml/min/mmHg
DLCO unc % pred: 101 %
DLCO unc: 21.43 ml/min/mmHg
FEF 25-75 Post: 1.02 L/sec
FEF 25-75 Pre: 0.91 L/sec
FEF2575-%Change-Post: 13 %
FEF2575-%Pred-Post: 81 %
FEF2575-%Pred-Pre: 72 %
FEV1-%Change-Post: 3 %
FEV1-%Pred-Post: 94 %
FEV1-%Pred-Pre: 91 %
FEV1-Post: 1.99 L
FEV1-Pre: 1.93 L
FEV1FVC-%Change-Post: 0 %
FEV1FVC-%Pred-Pre: 81 %
FEV6-%Change-Post: 2 %
FEV6-%Pred-Post: 120 %
FEV6-%Pred-Pre: 116 %
FEV6-Post: 3.44 L
FEV6-Pre: 3.35 L
FEV6FVC-%Change-Post: 0 %
FEV6FVC-%Pred-Post: 107 %
FEV6FVC-%Pred-Pre: 108 %
FVC-%Change-Post: 2 %
FVC-%Pred-Post: 111 %
FVC-%Pred-Pre: 108 %
FVC-Post: 3.49 L
FVC-Pre: 3.39 L
Post FEV1/FVC ratio: 57 %
Post FEV6/FVC ratio: 99 %
Pre FEV1/FVC ratio: 57 %
Pre FEV6/FVC Ratio: 99 %
RV % pred: 197 %
RV: 5.3 L
TLC % pred: 135 %
TLC: 8.77 L

## 2020-01-31 NOTE — Progress Notes (Signed)
Full PFT performed today. °

## 2020-02-07 DIAGNOSIS — H6122 Impacted cerumen, left ear: Secondary | ICD-10-CM | POA: Diagnosis not present

## 2020-02-07 DIAGNOSIS — G8929 Other chronic pain: Secondary | ICD-10-CM | POA: Diagnosis not present

## 2020-02-07 DIAGNOSIS — J449 Chronic obstructive pulmonary disease, unspecified: Secondary | ICD-10-CM | POA: Diagnosis not present

## 2020-02-07 DIAGNOSIS — Z0000 Encounter for general adult medical examination without abnormal findings: Secondary | ICD-10-CM | POA: Diagnosis not present

## 2020-02-07 DIAGNOSIS — Z1389 Encounter for screening for other disorder: Secondary | ICD-10-CM | POA: Diagnosis not present

## 2020-02-07 DIAGNOSIS — I350 Nonrheumatic aortic (valve) stenosis: Secondary | ICD-10-CM | POA: Diagnosis not present

## 2020-02-07 DIAGNOSIS — I272 Pulmonary hypertension, unspecified: Secondary | ICD-10-CM | POA: Diagnosis not present

## 2020-02-07 DIAGNOSIS — D692 Other nonthrombocytopenic purpura: Secondary | ICD-10-CM | POA: Diagnosis not present

## 2020-02-07 DIAGNOSIS — M5441 Lumbago with sciatica, right side: Secondary | ICD-10-CM | POA: Diagnosis not present

## 2020-02-07 DIAGNOSIS — G629 Polyneuropathy, unspecified: Secondary | ICD-10-CM | POA: Diagnosis not present

## 2020-02-07 DIAGNOSIS — J9611 Chronic respiratory failure with hypoxia: Secondary | ICD-10-CM | POA: Diagnosis not present

## 2020-02-07 DIAGNOSIS — Z79899 Other long term (current) drug therapy: Secondary | ICD-10-CM | POA: Diagnosis not present

## 2020-02-09 ENCOUNTER — Telehealth: Payer: Self-pay | Admitting: Internal Medicine

## 2020-02-09 ENCOUNTER — Telehealth: Payer: Self-pay

## 2020-02-09 NOTE — Telephone Encounter (Signed)
Called and spoke to pt. Informed him of the results per Dr. Annamaria Boots. Pt verbalized understanding and denied any further questions or concerns at this time.

## 2020-02-09 NOTE — Telephone Encounter (Signed)
Pt had PFT performed 8/3 and is calling requesting to know the results. Dr. Annamaria Boots, please advise.

## 2020-02-09 NOTE — Telephone Encounter (Signed)
Pulmonary Function Test was normal except for minimal slowing of airflow. Oxygen exchange was normal.This doesn't indicate that lung disease is likely to be an important cause of shortness of breath.

## 2020-02-09 NOTE — Telephone Encounter (Signed)
Wife called stating to cancel 02/14/20 appointments and that she would call back to reschedule. Appointments canceled as requested.

## 2020-02-14 ENCOUNTER — Inpatient Hospital Stay: Payer: PPO

## 2020-02-14 ENCOUNTER — Inpatient Hospital Stay: Payer: PPO | Admitting: Oncology

## 2020-03-17 ENCOUNTER — Telehealth: Payer: Self-pay | Admitting: Pulmonary Disease

## 2020-03-17 NOTE — Telephone Encounter (Signed)
Patient called to say he woke up spitting up blood. Minimal in intensity with foam and clots It is getting better through the day and has minimal symptoms at present Denies any cough, fevers, chills, dyspnea  Advised him to observe and call back or go to the ED if it recurs with high intensity  Please get a chest x-ray next week and follow-up with Dr. Annamaria Boots or APP for evaluation.  Marshell Garfinkel MD [facility] Please see [email or web address] for pager details.  03/17/2020, 1:35 PM

## 2020-03-19 NOTE — Telephone Encounter (Signed)
Please call Mr [name] [name] about his report of coughing up blood last night. Arrange for CXR today dx hemoptysis. If still happening, please get him in this week with me or an APP.  Thanks

## 2020-03-19 NOTE — Telephone Encounter (Signed)
ATC patient regarding prior message. Called both phones unable to leave voicemail on patient's phone.

## 2020-03-19 NOTE — Telephone Encounter (Signed)
Tried calling home number and line was busy. Called cell and no answer- no VM set up yet.

## 2020-03-20 ENCOUNTER — Encounter: Payer: Self-pay | Admitting: *Deleted

## 2020-03-20 NOTE — Telephone Encounter (Signed)
Tried calling the pt again and his line was busy- will mail letter

## 2020-04-30 DIAGNOSIS — G4734 Idiopathic sleep related nonobstructive alveolar hypoventilation: Secondary | ICD-10-CM | POA: Diagnosis not present

## 2020-04-30 DIAGNOSIS — J449 Chronic obstructive pulmonary disease, unspecified: Secondary | ICD-10-CM | POA: Diagnosis not present

## 2020-05-08 DIAGNOSIS — J449 Chronic obstructive pulmonary disease, unspecified: Secondary | ICD-10-CM | POA: Diagnosis not present

## 2020-05-08 DIAGNOSIS — I350 Nonrheumatic aortic (valve) stenosis: Secondary | ICD-10-CM | POA: Diagnosis not present

## 2020-05-10 ENCOUNTER — Telehealth: Payer: Self-pay | Admitting: Internal Medicine

## 2020-05-10 NOTE — Telephone Encounter (Signed)
Called pt back x 2 and each time line was busy, WCB.

## 2020-05-10 NOTE — Telephone Encounter (Signed)
Tried calling again 2 more x and line is still busy

## 2020-05-11 MED ORDER — AZITHROMYCIN 250 MG PO TABS
250.0000 mg | ORAL_TABLET | ORAL | 0 refills | Status: DC
Start: 2020-05-11 — End: 2020-10-29

## 2020-05-11 NOTE — Telephone Encounter (Signed)
Primary Pulmonologist: Dr. Annamaria Boots Last office visit and with whom: 12/03/19-Dr. Annamaria Boots  What do we see them for (pulmonary problems): chronic obstructive asthma  Last OV assessment/plan:  Instructions    Return in about 6 months (around 06/23/2020). Order- schedule PFT    Dx chronic obstructive asthma  You benefit from oxygen at night and should continue to use it.  Sample x 2 Breztri inhaler    Inhale 2 puffs, then rinse mouth, twice daily.  If this helps your breathing better than Symbicort, and if it doesn't bother your prostate, then let us know and we can send a prescription.  Please call if we can help       Was appointment offered to patient (explain)?  Patient would like Dr. Janee Morn recommendations   Reason for call:  Called and spoke with Patient.  Patient stated he has had increased wheezing and sob the past two days.  Patient stated he has sob with rest and exertion.  Patient denies any cough, or fever.  Patient stated he is using his symbicort twice daily and using nebulizer as instructed. Patient request any prescriptions to go to [facility].  Message routed to Dr. Annamaria Boots to advise  Allergies  Allergen Reactions  . Fenofibrate     Other reaction(s): pancreatitis  . Zocor  [Simvastatin]     Other reaction(s): muscle weakness    Immunization History  Administered Date(s) Administered  . Influenza Split 03/31/2011, 03/30/2012, 03/30/2013, 02/28/2014, 04/07/2016  . Influenza Whole 03/30/2010  . Influenza, High Dose Seasonal PF 03/11/2016, 03/30/2017, 03/28/2019  . Influenza, Quadrivalent, Recombinant, Inj, Pf 04/12/2018  . Influenza,inj,Quad PF,6+ Mos 03/31/2015  . PFIZER SARS-COV-2 Vaccination 07/20/2019, 08/09/2019  . Pneumococcal Conjugate-13 06/30/2013  . Pneumococcal Polysaccharide-23 05/30/2012  . Zoster 05/30/2012

## 2020-05-11 NOTE — Telephone Encounter (Signed)
Spoke with the pt and notified of response per Dr Annamaria Boots  He verbalized understanding  Rx was sent for zpack  He states already has pred on hand, enough for the 20 mg daily for 5 days dose  Will call of not improving

## 2020-05-11 NOTE — Telephone Encounter (Signed)
Offer ZpaK 250 MG, # 6, 2 today then one daily          Prednisone 20 mg, # 5, 1 daily

## 2020-05-11 NOTE — Telephone Encounter (Signed)
ATC Patient home number, busy signal, and cell number went to VM, with  not way to leave a message.

## 2020-05-11 NOTE — Telephone Encounter (Signed)
Lm x1 for patient.  

## 2020-05-30 DIAGNOSIS — G4734 Idiopathic sleep related nonobstructive alveolar hypoventilation: Secondary | ICD-10-CM | POA: Diagnosis not present

## 2020-05-30 DIAGNOSIS — J449 Chronic obstructive pulmonary disease, unspecified: Secondary | ICD-10-CM | POA: Diagnosis not present

## 2020-06-04 ENCOUNTER — Telehealth: Payer: Self-pay | Admitting: Internal Medicine

## 2020-06-04 DIAGNOSIS — L821 Other seborrheic keratosis: Secondary | ICD-10-CM | POA: Diagnosis not present

## 2020-06-04 DIAGNOSIS — Z85828 Personal history of other malignant neoplasm of skin: Secondary | ICD-10-CM | POA: Diagnosis not present

## 2020-06-04 DIAGNOSIS — L308 Other specified dermatitis: Secondary | ICD-10-CM | POA: Diagnosis not present

## 2020-06-04 DIAGNOSIS — Z85820 Personal history of malignant melanoma of skin: Secondary | ICD-10-CM | POA: Diagnosis not present

## 2020-06-04 DIAGNOSIS — C44319 Basal cell carcinoma of skin of other parts of face: Secondary | ICD-10-CM | POA: Diagnosis not present

## 2020-06-04 NOTE — Telephone Encounter (Signed)
Attempted to call pt but received a busy signal. Tried to call back and again received a busy signal. Will try to call back later.

## 2020-06-06 ENCOUNTER — Encounter: Payer: Self-pay | Admitting: Internal Medicine

## 2020-06-06 MED ORDER — BREO ELLIPTA 100-25 MCG/INH IN AEPB: 1.0000 | INHALATION_SPRAY | Freq: Every day | RESPIRATORY_TRACT | 5 refills | Status: DC

## 2020-06-06 NOTE — Telephone Encounter (Signed)
06/06/20   Contacted the patient.  He is reporting he is having difficulty with using his Symbicort inhaler.  He reports it is getting "clogged".  He is struggling with using his HFA inhaler.  He reports that he is looked online and this sometimes happens.  He has tried to run this under hot water to help with this issue.  He continues to have the problem.  He would like to switch his inhaler from Symbicort 160 to  something "that he can rely on".  Dr. Annamaria Boots please advise.  If you are agreeable to having the patient switch inhalers to another ICS/LABA please let us know and then we can follow-up with the patient.  Wyn Quaker, FNP

## 2020-06-06 NOTE — Telephone Encounter (Signed)
Suggest we try Breo 100   inhale 1 puff, then rinse mouth, once daily  instead of Symbicort

## 2020-06-06 NOTE — Telephone Encounter (Signed)
Called and spoke with pt letting him know the info stated by CY and he verbalized understanding. Rx for Breo 100 has been sent to pt's preferred pharmacy and have removed both the Symbicort and Breztri inhalers off of pt's med list. Nothing further needed.

## 2020-06-06 NOTE — Telephone Encounter (Signed)
Will route back to triage pool for follow up   [name] [name]

## 2020-06-06 NOTE — Telephone Encounter (Signed)
Pt returning call from office . Please call pt on [phone]. Please advise

## 2020-06-11 ENCOUNTER — Ambulatory Visit: Payer: PPO | Admitting: Cardiovascular Disease

## 2020-06-12 ENCOUNTER — Other Ambulatory Visit: Payer: Self-pay

## 2020-06-12 ENCOUNTER — Encounter (HOSPITAL_COMMUNITY): Payer: Self-pay | Admitting: Emergency Medicine

## 2020-06-12 ENCOUNTER — Emergency Department (HOSPITAL_COMMUNITY)
Admission: EM | Admit: 2020-06-12 | Discharge: 2020-06-12 | Disposition: A | Discharge status: Home / Self Care | Payer: PPO | Attending: Emergency Medicine | Admitting: Emergency Medicine

## 2020-06-12 DIAGNOSIS — L7622 Postprocedural hemorrhage and hematoma of skin and subcutaneous tissue following other procedure: Secondary | ICD-10-CM | POA: Diagnosis not present

## 2020-06-12 DIAGNOSIS — Z5321 Procedure and treatment not carried out due to patient leaving prior to being seen by health care provider: Secondary | ICD-10-CM | POA: Diagnosis not present

## 2020-06-12 LAB — BASIC METABOLIC PANEL
Anion gap: 8 (ref 5–15)
BUN: 30 mg/dL — ABNORMAL HIGH (ref 8–23)
CO2: 28 mmol/L (ref 22–32)
Calcium: 9 mg/dL (ref 8.9–10.3)
Chloride: 102 mmol/L (ref 98–111)
Creatinine, Ser: 0.66 mg/dL (ref 0.61–1.24)
GFR, Estimated: >60 mL/min (ref >60)
Glucose, Bld: 113 mg/dL — ABNORMAL HIGH (ref 70–99)
Potassium: 3.8 mmol/L (ref 3.5–5.1)
Sodium: 138 mmol/L (ref 135–145)

## 2020-06-12 LAB — CBC WITH DIFFERENTIAL/PLATELET
Abs Immature Granulocytes: 0.01 10*3/uL (ref 0.00–0.07)
Basophils Absolute: 0 10*3/uL (ref 0.0–0.1)
Basophils Relative: 0 %
Eosinophils Absolute: 0.3 10*3/uL (ref 0.0–0.5)
Eosinophils Relative: 4 %
HCT: 38.8 % — ABNORMAL LOW (ref 39.0–52.0)
Hemoglobin: 11.9 g/dL — ABNORMAL LOW (ref 13.0–17.0)
Immature Granulocytes: 0 %
Lymphocytes Relative: 17 %
Lymphs Abs: 1.2 10*3/uL (ref 0.7–4.0)
MCH: 31.2 pg (ref 26.0–34.0)
MCHC: 30.7 g/dL (ref 30.0–36.0)
MCV: 101.8 fL — ABNORMAL HIGH (ref 80.0–100.0)
Monocytes Absolute: 0.6 10*3/uL (ref 0.1–1.0)
Monocytes Relative: 8 %
Neutro Abs: 4.7 10*3/uL (ref 1.7–7.7)
Neutrophils Relative %: 71 %
Platelets: 200 10*3/uL (ref 150–400)
RBC: 3.81 MIL/uL — ABNORMAL LOW (ref 4.22–5.81)
RDW: 13.9 % (ref 11.5–15.5)
WBC: 6.8 10*3/uL (ref 4.0–10.5)
nRBC: 0 % (ref 0.0–0.2)

## 2020-06-12 NOTE — ED Notes (Signed)
Called x2 for update vitals. No response

## 2020-06-12 NOTE — ED Triage Notes (Signed)
Patient reports heavy bleeding left temporal scalp this morning , he had a skin biopsy/cautery  done at [facility] 6 days ago [facility] , dressing applied prior to arrival.

## 2020-06-12 NOTE — ED Notes (Signed)
Called pt x3 for VS update. No response from pt.

## 2020-06-25 ENCOUNTER — Telehealth: Payer: Self-pay | Admitting: Cardiovascular Disease

## 2020-06-25 NOTE — Telephone Encounter (Signed)
New message:     Patient wife stating that some one called them. I did not see a note.

## 2020-06-25 NOTE — Telephone Encounter (Signed)
Spoke with patient's wife and advised no reason for call identified. I verified the patient's January appointment with Dr. Elease Hashimoto and advised her to call back prior with questions or concerns. She verbalized understanding and agreement and thanked me for the call.

## 2020-06-30 DIAGNOSIS — G4734 Idiopathic sleep related nonobstructive alveolar hypoventilation: Secondary | ICD-10-CM | POA: Diagnosis not present

## 2020-06-30 DIAGNOSIS — J449 Chronic obstructive pulmonary disease, unspecified: Secondary | ICD-10-CM | POA: Diagnosis not present

## 2020-07-01 NOTE — Progress Notes (Signed)
Subjective:    Patient ID: [name] [name], male    DOB: 1927/07/02, 87 y.o.   MRN: [id number]  HPI M former smoker followed for chronic obstructive asthma, Allergic rhintiis,  COPD, suspected asbestos exposure after working in [employer]. CAD, PHTN 02/16/15- 99%, 98%, 98%, 555 feet, no oxygen limitation. Walked at slow pace with an unsteady gait Overnight oximetry 02/21/2015 did qualify for sleep O2 EchoCardiogram 10/21/16-moderate pulmonary hypertension  (43 S) ---------------------------------------------------------------------------------------   12/23/19- 87 year old male former smoker followed for chronic obstructive asthma/steroid dependent, allergic rhinitis, nasal polyps, suspected asbestos exposure after working in [employer], complicated by history SVT/palpitation/ CAD, moderate aortic stenosis, PHTN adrenal insufficiency, chronic leg and back pain, Melanoma resected R ear, Spina; Stenosis/Peripheral Neuropathy,  O2 2L/ sleep/no DME-he owns his concentratorbut needs replaced. Dr Elease Hashimoto follows for Cardiology- has recommended TAVR but pt still reluctant.  Had 2 Phizer Covax Denies cough or wheeze. Denies angina or syncope. Also dealing with BPH- discussed trial of sample med with [facility].  07/02/20- 87 year old male former smoker followed for chronic obstructive asthma/steroid dependent, allergic rhinitis, nasal polyps, suspected asbestos exposure after working in [employer], complicated by history SVT/palpitation/ CAD, moderate Aortic Stenosis, PHTN adrenal insufficiency, chronic leg and back pain, Melanoma resected R ear, Spinal Stenosis/Peripheral Neuropathy,  O2 2L/ sleep/no DME-he owns his concentrator  Still no TAVR PFT 01/31/20- minimal obst, no resp to BD, overinflation and airtrapping, Nl DLCO F/F .57, FVC 108%,FEV1 91% ECHO 6/18- severe AS with mod MS Covid vax- 3 Phizer Flu vax-had Given trial of Breztri Breo 100, Ventolin hfa, neb albuterol,  He feels very  limted by back pain/ neuropathy. Doessn't move around much to challenge his valvular heart disease and continues to defer decision on TAVR.  ROS-see HPI   + = positive Constitutional:   + weight loss, night sweats, fevers, chills, fatigue, lassitude. HEENT:   + headaches, difficulty swallowing, tooth/dental problems, sore throat,       No-  sneezing, itching, ear ache, +nasal congestion, post nasal drip,  CV:  No-   chest pain, orthopnea, PND, swelling in lower extremities, anasarca,  dizziness, +palpitations Resp: + shortness of breath with exertion or at rest.              No-   productive cough,  No non-productive cough,  No- coughing up of blood.              No-   change in color of mucus.  + wheezing.   Skin: No-   rash or lesions. GI:  No-   heartburn, indigestion, abdominal pain, nausea, vomiting, GU:  MS:  + joint pain or swelling.  Neuro-     +chronic paresthesias and weakness in lower extremities Psych:  No- change in mood or affect. No depression or anxiety.  No memory loss.  OBJ   + Arrival room air saturation 97% General- Alert, Oriented, Affect-appropriate, Distress- none acute,  Skin- rash-none, lesions- none, excoriation- none Lymphadenopathy- none Head- atraumatic            Eyes- Gross vision intact, PERRLA, conjunctivae clear secretions            Ears- Hearing, canals-normal, + partial resection R auricle            Nose- + mild turbinate edema, polyps-none seen this visit                        no-Septal dev, mucus,  erosion,  perforation             Throat- Mallampati II , mucosa clear , drainage- none, tonsils- atrophic Neck- flexible , trachea midline, no stridor , thyroid nl, carotid no bruit Chest - symmetrical excursion , unlabored           Heart/CV- ,+ Irregular with frequent extrasystoles, [id number] systolic murmur , no gallop  ,                            - JVD- none , edema- none, stasis changes- none, varices- none           Lung- +diminished but clear to  P&A, slow expiratory flow,  wheeze- none, cough- none , dullness-none, rub- none           Chest wall-  Abd-  Br/ Gen/ Rectal- Not done, not indicated Extrem- cyanosis- none, clubbing, none, atrophy- none, strength- nl, + cane Neuro- +tremor hands

## 2020-07-02 ENCOUNTER — Encounter: Payer: Self-pay | Admitting: Internal Medicine

## 2020-07-02 ENCOUNTER — Ambulatory Visit: Payer: PPO | Admitting: Internal Medicine

## 2020-07-02 ENCOUNTER — Other Ambulatory Visit: Payer: Self-pay

## 2020-07-02 DIAGNOSIS — J449 Chronic obstructive pulmonary disease, unspecified: Secondary | ICD-10-CM

## 2020-07-02 DIAGNOSIS — I350 Nonrheumatic aortic (valve) stenosis: Secondary | ICD-10-CM

## 2020-07-02 MED ORDER — BREZTRI AEROSPHERE 160-9-4.8 MCG/ACT IN AERO: 2.0000 | INHALATION_SPRAY | Freq: Two times a day (BID) | RESPIRATORY_TRACT | 0 refills | Status: DC

## 2020-07-02 NOTE — Patient Instructions (Addendum)
Order- sample x 2 Breztri inhaler   Inhale 2puffs then rinse mouth, twice daily.   Try this instead of Breo. When samples run out, go back to [place] for comparison. See if you have a preference.  Please call if we can help

## 2020-07-24 DIAGNOSIS — D692 Other nonthrombocytopenic purpura: Secondary | ICD-10-CM | POA: Diagnosis not present

## 2020-07-24 DIAGNOSIS — M542 Cervicalgia: Secondary | ICD-10-CM | POA: Diagnosis not present

## 2020-07-24 DIAGNOSIS — I272 Pulmonary hypertension, unspecified: Secondary | ICD-10-CM | POA: Diagnosis not present

## 2020-07-24 DIAGNOSIS — I350 Nonrheumatic aortic (valve) stenosis: Secondary | ICD-10-CM | POA: Diagnosis not present

## 2020-07-24 DIAGNOSIS — J449 Chronic obstructive pulmonary disease, unspecified: Secondary | ICD-10-CM | POA: Diagnosis not present

## 2020-07-24 DIAGNOSIS — J9611 Chronic respiratory failure with hypoxia: Secondary | ICD-10-CM | POA: Diagnosis not present

## 2020-07-24 DIAGNOSIS — I5032 Chronic diastolic (congestive) heart failure: Secondary | ICD-10-CM | POA: Diagnosis not present

## 2020-07-27 ENCOUNTER — Other Ambulatory Visit: Payer: Self-pay

## 2020-07-27 ENCOUNTER — Ambulatory Visit: Payer: PPO | Admitting: Cardiovascular Disease

## 2020-07-27 ENCOUNTER — Encounter: Payer: Self-pay | Admitting: Cardiovascular Disease

## 2020-07-27 VITALS — BP 90/62 | HR 54 | Ht 68.5 in | Wt 160.0 lb

## 2020-07-27 DIAGNOSIS — I350 Nonrheumatic aortic (valve) stenosis: Secondary | ICD-10-CM | POA: Diagnosis not present

## 2020-07-27 DIAGNOSIS — I2510 Atherosclerotic heart disease of native coronary artery without angina pectoris: Secondary | ICD-10-CM

## 2020-07-27 NOTE — Progress Notes (Signed)
Patient ID: [name] [name], male   DOB: June 07, 1928, 87 y.o.   MRN: [id number]     Patient ID: [name] [name] MRN: [id number] DOB/AGE: [birth year] 87 y.o.   Referring Physician Dr. Felipa Eth  Problem List: 1. Coronary artery disease-status post remote stenting 2. Moderate aortic stenosis 3. Premature atrial contractions 4. Point hypertension-moderate    Previous notes from Dr. Irish Lack Reason for Consultation fatigue, CAD, palpitations  HPI: 87 y/o with COPD.  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  His activity is limited from lower extremity neuropathy.  He had a stent many years ago.  He followed with Dr. Leonia Reeves until a few years ago.  I   He has had more fatigue of late. His activity is decreasing due to his shortness of breath. Because of this, he would like to have a cardiac evaluation. Has been several years since he saw Dr. Leonia Reeves. Prior to his stent, he had some decreased exertion as well. No chest pain.   Recent echo in 3/16 showed normal LV function and moderate aortic stenosis.  Neuropathy is limiting exercise.   In the morning, he feels that his HR is irregular and elevated.  He will bear down and sx get better.    Oct 30 2016:   Transfer from Dr. Irish Lack Retired from [employer]  ([place])  Has palpitations ,   No hx of syncope.   No CP or dyspnea Still does his yard work.   Went to the gym until recently .   Has some leg issues.   March 15, 2018: [name] [name] is seen back today  Has COPD - is not more short of breath compared to usual He had an echocardiogram in August that revealed that his aortic valve gradient had now increased to a mean of 40.  This is up from last year which revealed a mean aortic valve gradient of 30 mmHg.  He denies any syncope or chest pain.  He has some baseline shortness of breath because of his COPD but is no more short of breath any used to be.  Still fairly active. Has spinal stenosis .     Aug. 6, 2020  [name] [name] is seen today for follow up of his aortic stenosis. Has severe AS.    Has spinal stensis,  Uses a walker  Or cane to walk   June 02, 2019: [name] [name] is seen today for follow-up of his aortic stenosis.  He has severe aortic stenosis and has been seen by the TAVR team.  Dr. Burt Knack was in favor of proceeding  with TAVR .  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  Has seen plastics and ENT.  Going to have an U/S on Monday   December 19, 2019:  [name] [name] is seen today for follow up of his aortic stenosis. He had an echo last week which showed severe AS with mean AV gradient of 57 mm Hg. Previous echo in Aug. 2020 showed a mean gradient of 37.   He has moderate MS with mean gradient of 6 mm Hg. He has been seen by Dr. Burt Knack and was thought to be a good candidate for TAVR but was then diagnosed witih melanoma and his TAVR work up was put on hold  Has had a right ear resection .  No further evidence of melanoma mets.   He is still having questons about TAVR .  Is unsure if he really needs to have TAVR .  We discussed the natural history of untreated aortic stenosis.  He notes that he has not had any episodes of angina certainly has not had any syncope or congestive heart failure.   Jan. 28, 2022: [name] [name] is seen today for follow up of his aortic stenosis We have discussed TAVR at some length in  the past He has a hx of COPD and is seen by Dr. Keturah Barre  Has aortic stenosis  Also has spinal stenosis   No cp, no syncope Mild DOE  ( but he has COPD also)    Current Outpatient Medications  Medication Sig Dispense Refill  . albuterol (PROAIR HFA) 108 (90 Base) MCG/ACT inhaler 2 puffs 4 times a day as needed 8 g 12  . albuterol (PROVENTIL) (2.5 MG/3ML) 0.083% nebulizer solution USE ONE VIAL VIA NEBULIZATION BY MOUTH DAILY OR TWICE A DAY 75 mL 3  . ALPRAZolam (XANAX) 1 MG tablet Take 1 mg by mouth at bedtime.     Marland Kitchen aspirin EC 81 MG tablet Take 1 tablet  (81 mg total) by mouth daily. Swallow whole. 30 tablet 11  . azithromycin (ZITHROMAX) 250 MG tablet Take 1 tablet (250 mg total) by mouth as directed. 6 tablet 0  . Budeson-Glycopyrrol-Formoterol (BREZTRI AEROSPHERE) 160-9-4.8 MCG/ACT AERO Inhale 2 puffs into the lungs 2 (two) times daily. 10.7 g 0  . calcium gluconate 500 MG tablet Take 1 tablet by mouth 3 (three) times daily.    Marland Kitchen CALCIUM-VITAMIN D PO Take 1 tablet by mouth daily.    . Cholecalciferol (VITAMIN D) 50 MCG (2000 UT) CAPS Take 2,000 Units by mouth daily.    . fluticasone furoate-vilanterol (BREO ELLIPTA) 100-25 MCG/INH AEPB Inhale 1 puff into the lungs daily. 60 each 5  . furosemide (LASIX) 20 MG tablet Take 20 mg by mouth daily as needed for edema.     Marland Kitchen ipratropium (ATROVENT) 0.02 % nebulizer solution INHALE 1 VIAL BY MOUTH VIA NEBULIZER EVERY 8 HOURS AS NEEDED FOR WHEEZING AND FOR SHORTNESS OF BREATH 75 mL 11  . ketoconazole (NIZORAL) 2 % cream Apply 1 application topically daily as needed for irritation. Reported on 07/30/2015    . nitroGLYCERIN (NITROSTAT) 0.4 MG SL tablet Place 0.4 mg under the tongue at bedtime.    Marland Kitchen ofloxacin (OCUFLOX) 0.3 % ophthalmic solution Place 1 drop into both eyes as needed.    . OXYGEN Inhale into the lungs.    . predniSONE (DELTASONE) 5 MG tablet TAKE TWO OR THREE TABLETS BY MOUTH DAILY OR AS DIRECTED 270 tablet 2  . terazosin (HYTRIN) 5 MG capsule Take 5 mg by mouth at bedtime. Once a day    . traMADol (ULTRAM) 50 MG tablet Take 50 mg by mouth every 6 (six) hours as needed (pain).     . triamcinolone cream (KENALOG) 0.1 % Apply 1 application topically 2 (two) times daily as needed (irritation).     . vitamin B-12 (CYANOCOBALAMIN) 1000 MCG tablet Take 1,000 mcg by mouth daily.     No current facility-administered medications for this visit.   Past Medical History:  Diagnosis Date  . Adrenal insufficiency ([place])   . Allergic rhinitis   . Aortic stenosis, moderate   . Asthma   . Chronic  sinusitis   . COPD (chronic obstructive pulmonary disease) ([place])   . H/O: asbestos exposure   . Nasal polyps   . PAC (premature atrial contraction)    EKG 12/03/09    Family History  Problem Relation Age of Onset  .  Heart disease Father   . Alzheimer's disease Mother   . Heart disease Paternal Uncle     Social History   Socioeconomic History  . Marital status: Married    Spouse name: Not on file  . Number of children: Not on file  . Years of education: Not on file  . Highest education level: Not on file  Occupational History  . Occupation: Armed forces training and education officer  Tobacco Use  . Smoking status: Former Smoker    Packs/day: 0.50    Years: 20.00    Pack years: 10.00    Types: Cigarettes    Quit date: 06/30/1973    Years since quitting: 47.1  . Smokeless tobacco: Never Used  Vaping Use  . Vaping Use: Never used  Substance and Sexual Activity  . Alcohol use: No    Alcohol/week: 0.0 standard drinks  . Drug use: No  . Sexual activity: Not on file  Other Topics Concern  . Not on file  Social History Narrative  . Not on file   Social Determinants of Health   Financial Resource Strain: Not on file  Food Insecurity: Not on file  Transportation Needs: Not on file  Physical Activity: Not on file  Stress: Not on file  Social Connections: Not on file  Intimate Partner Violence: Not on file    Past Surgical History:  Procedure Laterality Date  . HERNIA REPAIR    . TONSILLECTOMY        Review of systems complete and found to be negative unless listed above .  No nausea, vomiting.  No fever chills, No focal weakness,  No palpitations.  Physical Exam: Blood pressure 90/62, pulse (!) 54, height 5' 8.5" (1.74 m), weight 160 lb (72.6 kg), SpO2 94 %.  GEN:  Elderly , frail man,   walks with walker  HEENT: Normal NECK: No JVD; No carotid bruits LYMPHATICS: No lymphadenopathy CARDIAC: irreg. Irreg.  3/6 systolic murmur  RESPIRATORY:  Clear to auscultation without rales, wheezing or  rhonchi  ABDOMEN: Soft, non-tender, non-distended MUSCULOSKELETAL:  No edema; No deformity  SKIN: Warm and dry NEUROLOGIC:  Alert and oriented x 3   Labs:   EKG:   July 27, 2020: Normal sinus rhythm.  He has premature atrial contractions and occasional premature junctional contractions.  No ST or T wave changes.    ASSESSMENT AND PLAN:  1.  Coronary artery disease:  No angina    2.  Aortic stenosis - .   He is very hesitant to consider TAVR at this time.  He is severely limited by his spinal stenosis. I have asked him to continue to consider TAVR.  He has fairly severe spinal stenosis so he is ot sure  He still is undecided.  We reviewed the typical symptoms of aortic stenosis including chest pain, worsening shortness of breath, syncope.  We will keep this in mind.  I will see him again in 6 months for follow-up office visit.   Mertie Moores, MD  07/27/2020 6:03 PM    [address],  [address] [place], [place]  [address] Pager [phone] Phone: [phone]; Fax: [phone]

## 2020-07-27 NOTE — Patient Instructions (Signed)
Medication Instructions:  Your physician recommends that you continue on your current medications as directed. Please refer to the Current Medication list given to you today.  *If you need a refill on your cardiac medications before your next appointment, please call your pharmacy*   Lab Work: None Ordered If you have labs (blood work) drawn today and your tests are completely normal, you will receive your results only by: MyChart Message (if you have MyChart) OR A paper copy in the mail If you have any lab test that is abnormal or we need to change your treatment, we will call you to review the results.   Testing/Procedures: None Ordered   Follow-Up: At [facility], you and your health needs are our priority.  As part of our continuing mission to provide you with exceptional heart care, we have created designated Provider Care Teams.  These Care Teams include your primary Cardiologist (physician) and Advanced Practice Providers (APPs -  Physician Assistants and Nurse Practitioners) who all work together to provide you with the care you need, when you need it.   Your next appointment:   6 month(s)  The format for your next appointment:   In Person  Provider:   You may see Philip Nahser, MD or one of the following Advanced Practice Providers on your designated Care Team:   Scott Weaver, PA-C Vin Bhagat, PA-C   

## 2020-08-01 ENCOUNTER — Other Ambulatory Visit: Payer: Self-pay

## 2020-08-01 ENCOUNTER — Inpatient Hospital Stay: Payer: PPO | Attending: Oncology | Admitting: Oncology

## 2020-08-01 ENCOUNTER — Inpatient Hospital Stay: Payer: PPO

## 2020-08-01 VITALS — BP 108/72 | HR 92 | Temp 98.3°F | Resp 18

## 2020-08-01 DIAGNOSIS — C4321 Malignant melanoma of right ear and external auricular canal: Secondary | ICD-10-CM | POA: Insufficient documentation

## 2020-08-01 DIAGNOSIS — C439 Malignant melanoma of skin, unspecified: Secondary | ICD-10-CM | POA: Diagnosis not present

## 2020-08-01 NOTE — Progress Notes (Signed)
Hematology and Oncology Follow Up   [name] [name] [id number] 1927/07/29 87 y.o. 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  He was found to have T3bN1 superficial spreading spindled melanoma.  He had no evidence of metastatic disease based on PET imaging in March 2021.  Prior Therapy:  He is status post FNA of a right cervical lymph node in the care of [name] [name] which did not show any evidence of melanoma. He is status post right ear excision completed on July 07, 2019 care of [name] [name]. He is status post ultrasound-guided biopsy of the right cervical lymph node completed on September 07, 2019.  The pathology showed oncocytic salivary gland tumor.   Current therapy: Active surveillance.  Interim History: [name] [name] is here for repeat follow-up.  Since the last visit, he reports no major changes in his health.  He remains ambulatory with the help of a walker without any falls or syncope.  He has chronic pain related to spinal stenosis which was aggravated at this time.  He denies any neurological deficits.  He denies any skin rash or nodules.     Medications: Updated on review. Current Outpatient Medications  Medication Sig Dispense Refill  . albuterol (PROAIR HFA) 108 (90 Base) MCG/ACT inhaler 2 puffs 4 times a day as needed 8 g 12  . albuterol (PROVENTIL) (2.5 MG/3ML) 0.083% nebulizer solution USE ONE VIAL VIA NEBULIZATION BY MOUTH DAILY OR TWICE A DAY 75 mL 3  . ALPRAZolam (XANAX) 1 MG tablet Take 1 mg by mouth at bedtime.     Marland Kitchen aspirin EC 81 MG tablet Take 1 tablet (81 mg total) by mouth daily. Swallow whole. 30 tablet 11  . azithromycin (ZITHROMAX) 250 MG tablet Take 1 tablet (250 mg total) by mouth as directed. 6 tablet 0  . Budeson-Glycopyrrol-Formoterol (BREZTRI AEROSPHERE) 160-9-4.8 MCG/ACT AERO Inhale 2 puffs into the lungs 2 (two) times daily. 10.7 g 0  .  calcium gluconate 500 MG tablet Take 1 tablet by mouth 3 (three) times daily.    Marland Kitchen CALCIUM-VITAMIN D PO Take 1 tablet by mouth daily.    . Cholecalciferol (VITAMIN D) 50 MCG (2000 UT) CAPS Take 2,000 Units by mouth daily.    . fluticasone furoate-vilanterol (BREO ELLIPTA) 100-25 MCG/INH AEPB Inhale 1 puff into the lungs daily. 60 each 5  . furosemide (LASIX) 20 MG tablet Take 20 mg by mouth daily as needed for edema.     Marland Kitchen ipratropium (ATROVENT) 0.02 % nebulizer solution INHALE 1 VIAL BY MOUTH VIA NEBULIZER EVERY 8 HOURS AS NEEDED FOR WHEEZING AND FOR SHORTNESS OF BREATH 75 mL 11  . ketoconazole (NIZORAL) 2 % cream Apply 1 application topically daily as needed for irritation. Reported on 07/30/2015    . nitroGLYCERIN (NITROSTAT) 0.4 MG SL tablet Place 0.4 mg under the tongue at bedtime.    Marland Kitchen ofloxacin (OCUFLOX) 0.3 % ophthalmic solution Place 1 drop into both eyes as needed.    . OXYGEN Inhale into the lungs.    . predniSONE (DELTASONE) 5 MG tablet TAKE TWO OR THREE TABLETS BY MOUTH DAILY OR AS DIRECTED 270 tablet 2  . terazosin (HYTRIN) 5 MG capsule Take 5 mg by mouth at bedtime. Once a day    . traMADol (ULTRAM) 50 MG tablet Take 50 mg by mouth every 6 (six) hours as needed (pain).     Marland Kitchen  triamcinolone cream (KENALOG) 0.1 % Apply 1 application topically 2 (two) times daily as needed (irritation).     . vitamin B-12 (CYANOCOBALAMIN) 1000 MCG tablet Take 1,000 mcg by mouth daily.     No current facility-administered medications for this visit.     Allergies:  Allergies  Allergen Reactions  . Fenofibrate     Other reaction(s): pancreatitis  . Zocor  [Simvastatin]     Other reaction(s): muscle weakness       Lab Results: Lab Results  Component Value Date   WBC 6.8 06/12/2020   HGB 11.9 (L) 06/12/2020   HCT 38.8 (L) 06/12/2020   MCV 101.8 (H) 06/12/2020   PLT 200 06/12/2020   Physical exam   General appearance: Comfortable appearing without any discomfort Head: Normocephalic  without any trauma Oropharynx: Mucous membranes are moist and pink without any thrush or ulcers. Eyes: Pupils are equal and round reactive to light. Lymph nodes: No cervical, supraclavicular, inguinal or axillary lymphadenopathy.   Heart:regular rate and rhythm.  S1 and S2 without leg edema. Lung: Clear without any rhonchi or wheezes.  No dullness to percussion. Abdomin: Soft, nontender, nondistended with good bowel sounds.  No hepatosplenomegaly. Musculoskeletal: No joint deformity or effusion.  Full range of motion noted. Neurological: No deficits noted on motor, sensory and deep tendon reflex exam. Skin: No lesions noted on inspection around his melanoma incision sites.     IMPRESSION: 1. Intensely Hypermetabolic RIGHT level II cervical lymph node consistent locoregional metastatic melanoma. 2. Mild metabolic activity associated with ill-defined LEFT level II lymph node is indeterminate. Favor benign activity. 3. No evidence of distant metastatic melanoma. 4. Bilateral calcified pleural plaques   Impression and Plan:  87 year old with:  1.    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  He completed surgical resection and currently on active surveillance.  The natural course of this disease was discussed at this time and treatment options were reviewed.  PET CT scan in March 2021 did not show any evidence of metastatic disease.  Risks and benefits of any additional therapy as well as the role for continued active surveillance were discussed.  He is a poor candidate for any systemic therapy given his multiple comorbidities and performance status.  Any treatment would be palliative if he develops metastatic disease at that time.  It would be a marginal candidate for any treatment.  At this time I recommended clinical surveillance only and repeat imaging studies as needed.   2.     Salivary gland tumor: No intervention is needed after discussion with [name] [name] given the indolent nature of that tumor.   3. follow-up: In 6 months for repeat follow-up.   30  minutes were dedicated to this visit. The time was spent on reviewing maging studies, discussing treatment options,  and answering questions regarding future plan.   Zola Button, MD 08/01/2020 12:45 PM

## 2020-08-15 ENCOUNTER — Telehealth: Payer: Self-pay | Admitting: Internal Medicine

## 2020-08-15 MED ORDER — BREO ELLIPTA 100-25 MCG/INH IN AEPB: 1.0000 | INHALATION_SPRAY | Freq: Every day | RESPIRATORY_TRACT | 5 refills | Status: DC

## 2020-08-15 NOTE — Telephone Encounter (Signed)
I have attempted to call the pt to see how he felt on the advair.  The phone keeps ringing a busy signal.    I have attempted to call Levada Dy at [facility] to speak with her regarding the message.  Pt was to try the advair for a month and then go back to breo and compare the 2 meds.  Need to see if he felt better on the breo or advair.  Will try to call both parties back .

## 2020-08-16 NOTE — Telephone Encounter (Signed)
Plan is ok. We will wait to hear from him which maintenance inhaler seems best for him.

## 2020-08-16 NOTE — Telephone Encounter (Signed)
Called and spoke to pt. Pt states he was on Advair for a short bit but didn't noticed any improvement in his breathing. Pt states he is now on Breo but per pt he states he has only been on this for 2 days. However, pt's chart shows pt has been on Breo for a while. Pt also states he tried the [place], as mentioned in his last OV note. There seems to be some confusion on what meds pt has been taking. Pt states he will try out the [facility] and call back next week to report how he feels on the [place].   [facility] pharmacy to see what medication he has been on but they do not open until 0900. WCB.

## 2020-08-16 NOTE — Telephone Encounter (Signed)
Called and spoke to [place] with Kristopher Oppenheim. She states when [facility] was originally sent to the pharmacy in December the pharmacy incorrectly filled the script for generic advair. Pt took the Advair for a month and then tried the [place] samples that was suggested at the last OV in 06/2020. Pt is now out of Breztri and is now on Breo for the first time. Pt is ok trying out Breo to see if works and will call back with an update.    Will forward this to Dr. Annamaria Boots to assure this plan is ok.

## 2020-08-16 NOTE — Telephone Encounter (Signed)
Noted. Will keep message open to follow up with pt late next week or early the week of 2/28.

## 2020-08-19 ENCOUNTER — Encounter: Payer: Self-pay | Admitting: Internal Medicine

## 2020-08-19 NOTE — Assessment & Plan Note (Signed)
Obstructive airway symptoms seem minimal. Plan- he is going to try samples of Breztri again

## 2020-08-19 NOTE — Assessment & Plan Note (Signed)
Echo from 6/18/211 now indicates severe AS but he is afraid of procedural risks of TAVR. I listened to his thoughts, and will leave that decison between him and his cardiologist.

## 2020-08-29 NOTE — Telephone Encounter (Signed)
LMTCB

## 2020-08-30 NOTE — Telephone Encounter (Signed)
Called and spoke to wife. She states she will have pt call back later as he was currently unavailable.

## 2020-08-30 NOTE — Telephone Encounter (Signed)
Attempted to call pt but line rang busy. Tried to call back but again line rang busy. Will try to call pt back later about inhalers.

## 2020-08-31 ENCOUNTER — Encounter: Payer: Self-pay | Admitting: *Deleted

## 2020-08-31 NOTE — Telephone Encounter (Signed)
Tried calling the pt and line still busy  Closing encounter and mailed letter to have pt call us

## 2020-09-06 ENCOUNTER — Telehealth: Payer: Self-pay | Admitting: Internal Medicine

## 2020-09-06 MED ORDER — FLUTICASONE-SALMETEROL 250-50 MCG/DOSE IN AEPB: 1.0000 | INHALATION_SPRAY | Freq: Two times a day (BID) | RESPIRATORY_TRACT | 11 refills | Status: AC

## 2020-09-06 NOTE — Telephone Encounter (Signed)
Call returned to patient, confirmed DOB. Patient reports all these inhalers are getting confusing. He would like to get on and stay on advair. He feels like the advair is what works best for him.   CY please advise if okay to send refill for advair? If so we will D/C the Breztri and [place] off his med list unless contra-indicated. Thanks :)

## 2020-09-06 NOTE — Telephone Encounter (Signed)
Called and spoke with Patient. Patient made aware of Advair prescription being sent to pharmacy. Explained directions to Patient. Understanding stated. Nothing further at this time.

## 2020-09-06 NOTE — Telephone Encounter (Signed)
Please yes- order Advair 250, # 1, ref x 12, inhale 1 puff then rinse mouth, twice daily. DC Breztri and Breo from his list Thanks

## 2020-09-06 NOTE — Telephone Encounter (Signed)
Medication sent to pharmacy.   Attempted to contact patient, recording states voicemail states it has not been set up yet.   Nothing further needed at this time.

## 2020-09-06 NOTE — Addendum Note (Signed)
Addended by: Vivia Ewing on: 09/06/2020 03:47 PM   Modules accepted: Orders

## 2020-10-03 DIAGNOSIS — J449 Chronic obstructive pulmonary disease, unspecified: Secondary | ICD-10-CM | POA: Diagnosis not present

## 2020-10-03 DIAGNOSIS — I5032 Chronic diastolic (congestive) heart failure: Secondary | ICD-10-CM | POA: Diagnosis not present

## 2020-10-03 DIAGNOSIS — I350 Nonrheumatic aortic (valve) stenosis: Secondary | ICD-10-CM | POA: Diagnosis not present

## 2020-10-03 DIAGNOSIS — I272 Pulmonary hypertension, unspecified: Secondary | ICD-10-CM | POA: Diagnosis not present

## 2020-10-11 DIAGNOSIS — B354 Tinea corporis: Secondary | ICD-10-CM | POA: Diagnosis not present

## 2020-10-11 DIAGNOSIS — Z85820 Personal history of malignant melanoma of skin: Secondary | ICD-10-CM | POA: Diagnosis not present

## 2020-10-11 DIAGNOSIS — H61021 Chronic perichondritis of right external ear: Secondary | ICD-10-CM | POA: Diagnosis not present

## 2020-10-11 DIAGNOSIS — Z85828 Personal history of other malignant neoplasm of skin: Secondary | ICD-10-CM | POA: Diagnosis not present

## 2020-10-11 DIAGNOSIS — B353 Tinea pedis: Secondary | ICD-10-CM | POA: Diagnosis not present

## 2020-10-29 ENCOUNTER — Telehealth: Payer: Self-pay | Admitting: Internal Medicine

## 2020-10-29 MED ORDER — DOXYCYCLINE HYCLATE 100 MG PO TABS: 100.0000 mg | ORAL_TABLET | Freq: Two times a day (BID) | ORAL | 0 refills | Status: AC

## 2020-10-29 NOTE — Telephone Encounter (Signed)
ATC x1 reached a busy signal.

## 2020-10-29 NOTE — Telephone Encounter (Signed)
Called and spoke with patient to let him know the recs of Dr. Annamaria Boots. He verified preferred pharmacy for RX to be sent in. Rx has been sent and advised him he can take mucinex. He expressed understanding. Nothing further needed at this time.

## 2020-10-29 NOTE — Telephone Encounter (Signed)
Suggest Mucinex DM otc   Rx doxycycline 100 mg, # 8, 2 today then one daily

## 2020-10-29 NOTE — Telephone Encounter (Signed)
Spoke with the pt  He c/o cough with yellow sputum x 5 days  He states he feels congested in his chest  Denies any increased SOB, wheezing, f/c/s, aches  He states he has not taken a covid test bc his spouse has same symptoms and her was neg  He has been vaccinated against covid x 3  Taking pred 10 mg daily  Please advise, thanks!  Allergies  Allergen Reactions  . Fenofibrate     Other reaction(s): pancreatitis  . Zocor  [Simvastatin]     Other reaction(s): muscle weakness   Current Outpatient Medications on File Prior to Visit  Medication Sig Dispense Refill  . albuterol (PROAIR HFA) 108 (90 Base) MCG/ACT inhaler 2 puffs 4 times a day as needed 8 g 12  . albuterol (PROVENTIL) (2.5 MG/3ML) 0.083% nebulizer solution USE ONE VIAL VIA NEBULIZATION BY MOUTH DAILY OR TWICE A DAY 75 mL 3  . ALPRAZolam (XANAX) 1 MG tablet Take 1 mg by mouth at bedtime.     Marland Kitchen aspirin EC 81 MG tablet Take 1 tablet (81 mg total) by mouth daily. Swallow whole. 30 tablet 11  . calcium gluconate 500 MG tablet Take 1 tablet by mouth 3 (three) times daily.    Marland Kitchen CALCIUM-VITAMIN D PO Take 1 tablet by mouth daily.    . Cholecalciferol (VITAMIN D) 50 MCG (2000 UT) CAPS Take 2,000 Units by mouth daily.    . Fluticasone-Salmeterol (ADVAIR) 250-50 MCG/DOSE AEPB Inhale 1 puff into the lungs every 12 (twelve) hours. 60 each 11  . furosemide (LASIX) 20 MG tablet Take 20 mg by mouth daily as needed for edema.     Marland Kitchen ipratropium (ATROVENT) 0.02 % nebulizer solution INHALE 1 VIAL BY MOUTH VIA NEBULIZER EVERY 8 HOURS AS NEEDED FOR WHEEZING AND FOR SHORTNESS OF BREATH 75 mL 11  . ketoconazole (NIZORAL) 2 % cream Apply 1 application topically daily as needed for irritation. Reported on 07/30/2015    . nitroGLYCERIN (NITROSTAT) 0.4 MG SL tablet Place 0.4 mg under the tongue at bedtime.    Marland Kitchen ofloxacin (OCUFLOX) 0.3 % ophthalmic solution Place 1 drop into both eyes as needed.    . OXYGEN Inhale into the lungs.    . predniSONE  (DELTASONE) 5 MG tablet TAKE TWO OR THREE TABLETS BY MOUTH DAILY OR AS DIRECTED 270 tablet 2  . terazosin (HYTRIN) 5 MG capsule Take 5 mg by mouth at bedtime. Once a day    . traMADol (ULTRAM) 50 MG tablet Take 50 mg by mouth every 6 (six) hours as needed (pain).     . triamcinolone cream (KENALOG) 0.1 % Apply 1 application topically 2 (two) times daily as needed (irritation).     . vitamin B-12 (CYANOCOBALAMIN) 1000 MCG tablet Take 1,000 mcg by mouth daily.     No current facility-administered medications on file prior to visit.

## 2020-10-29 NOTE — Telephone Encounter (Signed)
Called patient twice but I received a busy tone each call. Will attempt again later.

## 2020-12-11 ENCOUNTER — Other Ambulatory Visit: Payer: Self-pay | Admitting: Internal Medicine

## 2020-12-18 ENCOUNTER — Telehealth: Payer: Self-pay | Admitting: Internal Medicine

## 2020-12-18 NOTE — Telephone Encounter (Signed)
Left message for patient to call back  

## 2020-12-19 ENCOUNTER — Telehealth: Payer: Self-pay | Admitting: Internal Medicine

## 2020-12-19 ENCOUNTER — Ambulatory Visit: Payer: PPO | Admitting: Cardiovascular Disease

## 2020-12-19 MED ORDER — IPRATROPIUM BROMIDE 0.02 % IN SOLN: RESPIRATORY_TRACT | 11 refills | Status: AC

## 2020-12-19 NOTE — Telephone Encounter (Signed)
Pt states that he believes his upper respiratory infection has come back. Symptoms; sore throat, coughing, and wheezing, pt states that he is not coughing up mucus. Pharmacy; [facility] [id number] - [place], [place]. Pls regard; [phone]

## 2020-12-19 NOTE — Telephone Encounter (Signed)
Please see phone note dated 12/18/20- spoke with the pt and have sent msg to Dr Annamaria Boots. Closing this encounter as it is a duplicate.

## 2020-12-19 NOTE — Telephone Encounter (Signed)
Spoke with the pt  He is c/o wheezing, SOB, cough- non prod, sore throat off and on for approx 1 wk  He took at home covid test at onset of symptoms and it was neg  Spouse recently had URI- she was negative for covid  He denies any f/c/s, aches, chest pain  He is currently on Breo  He asks for something to be called in for the symptoms and also wants refill on atrovent nebs  He has albuterol neb on his MAR, but not using this bc it makes his heart race  Please advise thanks

## 2020-12-19 NOTE — Telephone Encounter (Signed)
Duplicate message. Will close encounter.  

## 2020-12-19 NOTE — Telephone Encounter (Signed)
I called and spoke with the pt and notified of response per Dr Annamaria Boots. He already has some pred on hand and did not want new rx called in. Will take what he has- given directions on how to take. Atrovent neb sent to pharm. Nothing further needed

## 2020-12-19 NOTE — Telephone Encounter (Signed)
Suggest prednisone 10 mg, # 20, 4 X 2 DAYS, 3 X 2 DAYS, 2 X 2 DAYS, 1 X 2 DAYS  Without fever, purulent sputum, I will wait on antibiotic.  Please refill his Atrovent neb solution x 1 year  He can be taking an otc cough syrup like Delsym if needed.

## 2020-12-26 ENCOUNTER — Other Ambulatory Visit: Payer: Self-pay | Admitting: Internal Medicine

## 2020-12-26 NOTE — Telephone Encounter (Signed)
Prednisone refilled

## 2020-12-26 NOTE — Telephone Encounter (Signed)
Patient requesting refill on Prednisone. Last OV 07/02/20. Last refill sent in on 12/23/19. Is it ok to refill?   Dr. Annamaria Boots please advise  Thank you

## 2021-01-02 NOTE — Progress Notes (Signed)
Subjective:    Patient ID: [name] [name], male    DOB: 1928/04/26, 87 y.o.   MRN: [id number]  HPI M former smoker followed for chronic obstructive asthma, Allergic rhintiis,  COPD, suspected asbestos exposure after working in [employer]. CAD, PHTN 6MWT 02/16/15- 99%, 98%, 98%, 555 feet, no oxygen limitation. Walked at slow pace with an unsteady gait Overnight oximetry 02/21/2015 did qualify for sleep O2 EchoCardiogram 10/21/16-moderate pulmonary hypertension  (13 S) ---------------------------------------------------------------------------------------  07/02/20- 87 year old male former smoker followed for chronic obstructive asthma/steroid dependent, allergic rhinitis, nasal polyps, suspected asbestos exposure after working in [employer], complicated by history SVT/palpitation/ CAD, moderate Aortic Stenosis, PHTN adrenal insufficiency, chronic leg and back pain, Melanoma resected R ear, Spinal Stenosis/Peripheral Neuropathy,  O2 2L/ sleep/no DME-he owns his concentrator  Still no TAVR PFT 01/31/20- minimal obst, no resp to BD, overinflation and airtrapping, Nl DLCO F/F .57, FVC 108%,FEV1 91% ECHO 6/18- severe AS with mod MS Covid vax- 3 Phizer Flu vax-had Given trial of Breztri Breo 100, Ventolin hfa, neb albuterol,  He feels very limted by back pain/ neuropathy. Doesn't move around much to challenge his valvular heart disease and continues to defer decision on TAVR.  01/03/21- 87 year old male former smoker followed for chronic obstructive asthma/steroid dependent, allergic rhinitis, nasal polyps, suspected asbestos exposure after working in [employer], complicated by history SVT/palpitation/ CAD, moderate Aortic Stenosis, PHTN adrenal insufficiency, chronic leg and back pain, Melanoma resected R ear, Spinal Stenosis/Peripheral Neuropathy,  O2 2L/ sleep/no DME-he owns his concentrator  Still no TAVR - Ventolin hfa, Breo 100, Neb albuterol,  Covid vax- 3 Phizer Took prednisone for  acute exacerbation 6/21. -----Having respiratory issues still, chest tightness with cough Followed by Dermatology and Oncology after resection of melanoma R ear, and underlying salivary tumor. Now on second round of prednisone- clear mucus, some chest tightness. Using rescue inhaler and Breo, but hasn't used nebulizer in long time. Cough, little phlegm. No chest pain, nodes, fever or blood. Dyspnea probably not changed.  ROS-see HPI   + = positive Constitutional:   + weight loss, night sweats, fevers, chills, fatigue, lassitude. HEENT:   + headaches, difficulty swallowing, tooth/dental problems, sore throat,       No-  sneezing, itching, ear ache, +nasal congestion, post nasal drip,  CV:  No-   chest pain, orthopnea, PND, swelling in lower extremities, anasarca,  dizziness, +palpitations Resp: + shortness of breath with exertion or at rest.              + productive cough,  No non-productive cough,  No- coughing up of blood.              No-   change in color of mucus.  + wheezing.   Skin: No-   rash or lesions. GI:  No-   heartburn, indigestion, abdominal pain, nausea, vomiting, GU:  MS:  + joint pain or swelling.  Neuro-     +chronic paresthesias and weakness in lower extremities Psych:  No- change in mood or affect. + depression or anxiety.  No memory loss.  OBJ   + Arrival room air saturation 95% General- Alert, Oriented, Affect-appropriate, Distress- none acute,  Skin- rash-none, lesions- none, excoriation- none Lymphadenopathy- none Head- atraumatic            Eyes- Gross vision intact, PERRLA, conjunctivae clear secretions            Ears- Hearing, canals-normal, + partial resection R auricle  Nose- + mild turbinate edema, polyps-none seen this visit                        no-Septal dev, mucus,  erosion, perforation             Throat- Mallampati II , mucosa clear , drainage- none, tonsils- atrophic Neck- flexible , trachea midline, no stridor , thyroid nl, carotid no  bruit Chest - symmetrical excursion , unlabored           Heart/CV- ,+ Irregular with occ extrasystoles, +9/8 systolic murmur , no gallop  ,                            - JVD- none , edema- none, stasis changes- none, varices- none           Lung- +diminished but clear to P&A, slow expiratory flow,  wheeze- none, cough- none , dullness-none, rub- none           Chest wall-  Abd-  Br/ Gen/ Rectal- Not done, not indicated Extrem- cyanosis- none, clubbing, none, atrophy- none, strength- nl, + rolling walker Neuro- +tremor hands

## 2021-01-03 ENCOUNTER — Ambulatory Visit (INDEPENDENT_AMBULATORY_CARE_PROVIDER_SITE_OTHER): Payer: PPO

## 2021-01-03 ENCOUNTER — Other Ambulatory Visit: Payer: Self-pay

## 2021-01-03 ENCOUNTER — Encounter: Payer: Self-pay | Admitting: Internal Medicine

## 2021-01-03 ENCOUNTER — Ambulatory Visit: Payer: PPO | Admitting: Internal Medicine

## 2021-01-03 VITALS — BP 122/74 | HR 79 | Temp 97.8°F | Ht 69.0 in | Wt 154.6 lb

## 2021-01-03 DIAGNOSIS — J449 Chronic obstructive pulmonary disease, unspecified: Secondary | ICD-10-CM | POA: Diagnosis not present

## 2021-01-03 DIAGNOSIS — J45901 Unspecified asthma with (acute) exacerbation: Secondary | ICD-10-CM

## 2021-01-03 DIAGNOSIS — J9611 Chronic respiratory failure with hypoxia: Secondary | ICD-10-CM | POA: Diagnosis not present

## 2021-01-03 DIAGNOSIS — J45909 Unspecified asthma, uncomplicated: Secondary | ICD-10-CM | POA: Diagnosis not present

## 2021-01-03 MED ORDER — TRELEGY ELLIPTA 200-62.5-25 MCG/INH IN AEPB: 1.0000 | INHALATION_SPRAY | Freq: Every day | RESPIRATORY_TRACT | 0 refills | Status: DC

## 2021-01-03 NOTE — Patient Instructions (Signed)
Order- CXR  dx  Asthma exacerbation  Sample of Breo 200 or Trelegy 200 if available, Otherwise we will send script to try Advair 500      Be sure to rinse your mouth well after any of these.

## 2021-01-04 ENCOUNTER — Telehealth: Payer: Self-pay | Admitting: Internal Medicine

## 2021-01-04 ENCOUNTER — Encounter: Payer: Self-pay | Admitting: Internal Medicine

## 2021-01-04 NOTE — Assessment & Plan Note (Signed)
Continues to benefit from O2 during sleep and exertion. [place] room air at rest.

## 2021-01-04 NOTE — Telephone Encounter (Signed)
Multiple attempts to call patient about abnormal CXR. Home phone remains busy. Mobile number just gives message mailbox not set up.

## 2021-01-04 NOTE — Telephone Encounter (Signed)
Call Report from [facility] from [facility] Radiology CXR:    IMPRESSION: Interval development of at least 2 large rounded masses are seen in the right lung concerning for metastatic disease or malignancy. CT scan of the chest is recommended for further evaluation. These results will be called to the ordering clinician or representative by the Radiologist Assistant, and communication documented in the PACS or zVision Dashboard.   Aortic Atherosclerosis (ICD10-I70.0).     Electronically Signed   By: Marijo Conception M.D.   On: 01/04/2021 09:31  CY FYI. Thanks :)

## 2021-01-04 NOTE — Assessment & Plan Note (Signed)
He c/o cough and chest tightness. He has c/o dyspnea on exertion for years- multifactorial. He is not wheezing much on exam now so I'm not sure what is going on, but doesn't seem acute. Hasn't responded much to prednisone. Plan- try samples Trelegy 200, CXR

## 2021-01-05 NOTE — Telephone Encounter (Signed)
I was able to reach [name] [name] at home by phone tonight and explained the CXR shows at least 2 masses in the right lung. We need to assume this is cancer. I explained I will have my office contact him Monday to begin scheduling a CT chest with contrast. We will also need labs- CBC w diff, CMET, ProTime.

## 2021-01-07 ENCOUNTER — Telehealth: Payer: Self-pay | Admitting: Internal Medicine

## 2021-01-07 DIAGNOSIS — R918 Other nonspecific abnormal finding of lung field: Secondary | ICD-10-CM

## 2021-01-07 NOTE — Telephone Encounter (Signed)
Spoke with pt and notified him that CT chest order would be placed along with lab orders. Pt stated understanding. CT and lab orders placed. Nothing further needed at this time.

## 2021-01-08 ENCOUNTER — Telehealth: Payer: Self-pay | Admitting: Internal Medicine

## 2021-01-08 NOTE — Telephone Encounter (Signed)
ATC Patient x's 2. Busy signal, unable to leave message. Will try again at a later time.

## 2021-01-09 ENCOUNTER — Other Ambulatory Visit (INDEPENDENT_AMBULATORY_CARE_PROVIDER_SITE_OTHER): Payer: PPO

## 2021-01-09 DIAGNOSIS — R918 Other nonspecific abnormal finding of lung field: Secondary | ICD-10-CM

## 2021-01-09 LAB — COMPREHENSIVE METABOLIC PANEL
ALT: 16 U/L (ref 0–53)
AST: 19 U/L (ref 0–37)
Albumin: 3.6 g/dL (ref 3.5–5.2)
Alkaline Phosphatase: 69 U/L (ref 39–117)
BUN: 21 mg/dL (ref 6–23)
CO2: 29 mEq/L (ref 19–32)
Calcium: 9.5 mg/dL (ref 8.4–10.5)
Chloride: 102 mEq/L (ref 96–112)
Creatinine, Ser: 0.75 mg/dL (ref 0.40–1.50)
GFR: 79.39 mL/min (ref >60.00)
Glucose, Bld: 119 mg/dL — ABNORMAL HIGH (ref 70–99)
Potassium: 4.2 mEq/L (ref 3.5–5.1)
Sodium: 137 mEq/L (ref 135–145)
Total Bilirubin: 0.9 mg/dL (ref 0.2–1.2)
Total Protein: 5.9 g/dL — ABNORMAL LOW (ref 6.0–8.3)

## 2021-01-09 LAB — PROTIME-INR
INR: 1 ratio (ref 0.8–1.0)
Prothrombin Time: 11.1 s (ref 9.6–13.1)

## 2021-01-10 LAB — CBC WITH DIFFERENTIAL/PLATELET
Basophils Absolute: 0 10*3/uL (ref 0.0–0.1)
Basophils Relative: 0.2 % (ref 0.0–3.0)
Eosinophils Absolute: 0 10*3/uL (ref 0.0–0.7)
Eosinophils Relative: 0.2 % (ref 0.0–5.0)
HCT: 35.6 % — ABNORMAL LOW (ref 39.0–52.0)
Hemoglobin: 12.2 g/dL — ABNORMAL LOW (ref 13.0–17.0)
Lymphocytes Relative: 2 % — ABNORMAL LOW (ref 12.0–46.0)
Lymphs Abs: 0.2 10*3/uL — ABNORMAL LOW (ref 0.7–4.0)
MCHC: 34.2 g/dL (ref 30.0–36.0)
MCV: 93.8 fl (ref 78.0–100.0)
Monocytes Absolute: 0.2 10*3/uL (ref 0.1–1.0)
Monocytes Relative: 2.1 % — ABNORMAL LOW (ref 3.0–12.0)
Neutro Abs: 9.9 10*3/uL — ABNORMAL HIGH (ref 1.4–7.7)
Neutrophils Relative %: 95.5 % — ABNORMAL HIGH (ref 43.0–77.0)
Platelets: 172 10*3/uL (ref 150.0–400.0)
RBC: 3.79 Mil/uL — ABNORMAL LOW (ref 4.22–5.81)
RDW: 15.1 % (ref 11.5–15.5)
WBC: 10.3 10*3/uL (ref 4.0–10.5)

## 2021-01-10 NOTE — Telephone Encounter (Signed)
ATC pt x 2. Line rang as busy. WCB.  Pt has scheduled CT chest on 7/15.

## 2021-01-11 ENCOUNTER — Other Ambulatory Visit: Payer: Self-pay

## 2021-01-11 ENCOUNTER — Ambulatory Visit
Admission: RE | Admit: 2021-01-11 | Discharge: 2021-01-11 | Disposition: A | Discharge status: Home / Self Care | Payer: PPO | Source: Ambulatory Visit | Attending: Internal Medicine | Admitting: Internal Medicine

## 2021-01-11 ENCOUNTER — Telehealth: Payer: Self-pay | Admitting: Oncology

## 2021-01-11 DIAGNOSIS — C7800 Secondary malignant neoplasm of unspecified lung: Secondary | ICD-10-CM | POA: Diagnosis not present

## 2021-01-11 DIAGNOSIS — Z85820 Personal history of malignant melanoma of skin: Secondary | ICD-10-CM | POA: Diagnosis not present

## 2021-01-11 DIAGNOSIS — J929 Pleural plaque without asbestos: Secondary | ICD-10-CM | POA: Diagnosis not present

## 2021-01-11 DIAGNOSIS — R918 Other nonspecific abnormal finding of lung field: Secondary | ICD-10-CM

## 2021-01-11 DIAGNOSIS — J439 Emphysema, unspecified: Secondary | ICD-10-CM | POA: Diagnosis not present

## 2021-01-11 MED ORDER — IOPAMIDOL (ISOVUE-300) INJECTION 61%
75.0000 mL | Freq: Once | INTRAVENOUS | Status: AC | PRN
  Administered 2021-01-11: 75 mL via INTRAVENOUS

## 2021-01-11 NOTE — Telephone Encounter (Signed)
Rescheduled 08/04 to 09/06 provider pal, patient has been called and voicemail was left.

## 2021-01-15 NOTE — Telephone Encounter (Signed)
Pt had CT chest on 7/15. Will close encounter.

## 2021-01-16 ENCOUNTER — Telehealth: Payer: Self-pay | Admitting: Internal Medicine

## 2021-01-16 NOTE — Telephone Encounter (Signed)
Home phone is busy on repeated tries, and mobile doesn't answer I had to call radiology to get CT scan read. Looks like metastatic melanoma and we need to refer back to Oncology for this.  We can Rx Advair 500 1 puff then rinse, twice daily, instead of Trelegy. We had talked about trying this next, since he used it in the past. I will keep trying to reach him.

## 2021-01-16 NOTE — Telephone Encounter (Signed)
Called and spoke with the pt  He is asking about results of chest ct done 01/11/21  It looks like images are available but not read by radiologist yet  He is also asking to try another inhaler bc he says breathing has not improved on trelegy  He wants to know if you can look at the ct images and tell him any results based off of that  Please advise, thanks

## 2021-01-17 ENCOUNTER — Telehealth: Payer: Self-pay | Admitting: Internal Medicine

## 2021-01-17 MED ORDER — ALBUTEROL SULFATE HFA 108 (90 BASE) MCG/ACT IN AERS: INHALATION_SPRAY | RESPIRATORY_TRACT | 12 refills | Status: AC

## 2021-01-17 NOTE — Telephone Encounter (Signed)
Duplicate documentation- phone note 7/21- I reached pt on his mobile phone (house phone always busy). Explained CT -probable metastatic melanoma. He will f/u with Oncology. Inhalers not helping, and probably won't, but he wants to try going back to Advair that helped in past.

## 2021-01-17 NOTE — Telephone Encounter (Signed)
I reached [name] [name] about his abnormal CT. Reached his mobile number- home line always busy). He asked about changing oncologists. I gave him number for Cancer center and suggested he talk to their office manager. He is going to continue current inhalers for now, but asked refill for albuterol rescue inhaler- done.

## 2021-01-21 ENCOUNTER — Telehealth: Payer: Self-pay | Admitting: Internal Medicine

## 2021-01-21 ENCOUNTER — Other Ambulatory Visit: Payer: PPO

## 2021-01-21 MED ORDER — TRELEGY ELLIPTA 100-62.5-25 MCG/INH IN AEPB: 1.0000 | INHALATION_SPRAY | Freq: Every day | RESPIRATORY_TRACT | 0 refills | Status: DC

## 2021-01-21 NOTE — Telephone Encounter (Signed)
He is looking into getting a second oncology opinion from Dr Julien Nordmann for his metastatic melanoma. Meanwhile he is trying to fid the most effective meds for the COPD component of his dyspnea.  He has albuterol neb ampules and ipratropium neb ampules  - He may use one of each in his nebulizer up to 4 times daily if helpful.  -  He will come by tomorrow to pick up samples Trelegy 100 to use 1 puff, once daily  Order- sample x 2 Trelegy 100    inhale 1 puff then rinse mouth, once daily

## 2021-01-21 NOTE — Telephone Encounter (Signed)
ATC patient to let him know that I will leave the samples at the front desk, but I received a busy tone. Will go ahead and leave the 2 samples at the desk for him.

## 2021-01-21 NOTE — Telephone Encounter (Signed)
Pt stated that he was recently diagnosed with lung cancer and he stated that what he has been taking has not been helping him and he is wanting to get advise from Dr. Annamaria Boots. Pt stated that he has been having difficulty breathing.   Pls regard; [phone]

## 2021-01-21 NOTE — Telephone Encounter (Signed)
Tried to call pt on home number but the number just rings busy. Was able to reach pt on his mobile number of [phone].  Stated to pt that I saw in a prior message that he said that pt wants to try to go back on Advair since it helped him in the past. Pt now wants to know if there are any other alternatives instead of the Advair before we send a new Rx to the pharmacy for him.   Asked pt if he had been able to contact Oncology to get an appt scheduled with them and pt said that he has tried but does not know how much good it will be for him to see Oncology.  Pt is requesting to have CY call him personally so he can discuss certain things with him.  Routing to Dr. Annamaria Boots. Please advise.

## 2021-01-23 ENCOUNTER — Other Ambulatory Visit: Payer: Self-pay | Admitting: Internal Medicine

## 2021-01-23 ENCOUNTER — Telehealth: Payer: Self-pay | Admitting: Internal Medicine

## 2021-01-23 MED ORDER — BREZTRI AEROSPHERE 160-9-4.8 MCG/ACT IN AERO: 2.0000 | INHALATION_SPRAY | Freq: Two times a day (BID) | RESPIRATORY_TRACT | 0 refills | Status: AC

## 2021-01-23 NOTE — Telephone Encounter (Signed)
Spoke with the pt and notified of response per Dr Annamaria Boots  Samples up front and clinical staff to show him how to use when he picks up  Nothing further needed

## 2021-01-23 NOTE — Telephone Encounter (Signed)
Please give him 2 samples of Breztr to try instead of Trrelegy Inhale 2 puffs then rinse mouth, twice daily

## 2021-01-23 NOTE — Telephone Encounter (Signed)
Spoke with the pt  He states would like to try samples of Breztri  He feels that the trelegy is "not getting into my lungs"  He states his breathing is no better since recent ov, but not any worse  He is using albuterol and ipratropium nebs both 4 x daily on average  Please advise thanks!

## 2021-01-25 ENCOUNTER — Telehealth: Payer: Self-pay | Admitting: Internal Medicine

## 2021-01-25 NOTE — Telephone Encounter (Signed)
Called and spoke with pt letting him know that we were going to send refill for his albuterol neb sol to the pharmacy for him and he verbalized understanding. Nothing further needed.

## 2021-01-26 ENCOUNTER — Other Ambulatory Visit: Payer: Self-pay

## 2021-01-26 ENCOUNTER — Emergency Department (HOSPITAL_COMMUNITY)
Admission: EM | Admit: 2021-01-26 | Discharge: 2021-01-26 | Disposition: A | Discharge status: Home / Self Care | Payer: PPO | Attending: Physician Assistant | Admitting: Physician Assistant

## 2021-01-26 ENCOUNTER — Encounter (HOSPITAL_COMMUNITY): Payer: Self-pay | Admitting: Emergency Medicine

## 2021-01-26 DIAGNOSIS — J449 Chronic obstructive pulmonary disease, unspecified: Secondary | ICD-10-CM | POA: Diagnosis not present

## 2021-01-26 DIAGNOSIS — R0602 Shortness of breath: Secondary | ICD-10-CM | POA: Diagnosis not present

## 2021-01-26 DIAGNOSIS — Z85118 Personal history of other malignant neoplasm of bronchus and lung: Secondary | ICD-10-CM | POA: Insufficient documentation

## 2021-01-26 DIAGNOSIS — R339 Retention of urine, unspecified: Secondary | ICD-10-CM | POA: Diagnosis not present

## 2021-01-26 DIAGNOSIS — Z5321 Procedure and treatment not carried out due to patient leaving prior to being seen by health care provider: Secondary | ICD-10-CM | POA: Diagnosis not present

## 2021-01-26 NOTE — ED Notes (Signed)
Bladder scan showed 136 and 145 mL of urine. No pain w/ bladder palpation.

## 2021-01-26 NOTE — ED Provider Notes (Signed)
Emergency Medicine Provider Triage Evaluation Note  [name] [name] , a 87 y.o. male  was evaluated in triage.  Pt complains of difficulty urinating.  Patient states that he has a history of BPH and over the past 24 to 48 hours has had worsening difficulty urinating.  States that he can still produce a small amount of urine but notes "it is just drops".  Denies any lower abdominal pain.  No chest pain or shortness of breath at this time.  Patient has a history of COPD as well as lung cancer.  Physical Exam  BP (!) 93/54 (BP Location: Right Arm)   Pulse (!) 106   Temp 98.2 F (36.8 C) (Oral)   Resp 20   Ht '5\' 9"'$  (1.753 m)   Wt 72.6 kg   SpO2 94%   BMI 23.63 kg/m  Gen:   Awake, no distress   Resp:  Normal effort  MSK:   Moves extremities without difficulty  Other:    Medical Decision Making  Medically screening exam initiated at 12:47 PM.  Appropriate orders placed.  Olevia Perches was informed that the remainder of the evaluation will be completed by another provider, this initial triage assessment does not replace that evaluation, and the importance of remaining in the ED until their evaluation is complete.   Rayna Sexton, PA-C 01/26/21 1248    Daleen Bo, MD 01/26/21 [phone]

## 2021-01-26 NOTE — ED Triage Notes (Signed)
Patient states he always has trouble urinating but the past few days it has been worse, states the pressure builds up but nothing really comes out. Hx of BPH.

## 2021-01-28 ENCOUNTER — Telehealth: Payer: Self-pay | Admitting: Internal Medicine

## 2021-01-28 MED ORDER — ALBUTEROL SULFATE (2.5 MG/3ML) 0.083% IN NEBU: INHALATION_SOLUTION | RESPIRATORY_TRACT | 11 refills | Status: AC

## 2021-01-28 NOTE — Telephone Encounter (Signed)
Called and spoke with Patient.  Patient stated he received his albuterol neb refill and it was not a full amount of medication, as he has already received in the past.   Albuterol nebs prescription was [facility] to [facility]m per month, 01/25/21 New Albuterol neb prescription [facility] to [facility]. Nothing further at this time.

## 2021-01-30 ENCOUNTER — Telehealth: Payer: Self-pay | Admitting: Oncology

## 2021-01-30 NOTE — Telephone Encounter (Signed)
Sch with Dr.Gorsuch ,informed patient, pt replied they are feeling unwell(shortness ofbreath) unable to know if they will make appt tomorrow. Pt wife on speaker, will call to cancel/ r/s tomorrow

## 2021-01-31 ENCOUNTER — Ambulatory Visit: Payer: PPO | Admitting: Hematology and Oncology

## 2021-01-31 ENCOUNTER — Telehealth: Payer: Self-pay | Admitting: Internal Medicine

## 2021-01-31 ENCOUNTER — Ambulatory Visit: Payer: PPO | Admitting: Oncology

## 2021-01-31 NOTE — Telephone Encounter (Signed)
Called and spoke with [name] [name].  Dr. Janee Morn recommendations given.  Understanding stated.  Nothing further at time.

## 2021-01-31 NOTE — Telephone Encounter (Signed)
He already has Xanax 1 mg from his PCP. He can try taking 1/2 or 1 tab up to 3 times daily if needed for anxiety. He should contact his PCP for any refill.

## 2021-01-31 NOTE — Telephone Encounter (Signed)
I spoke with [name] [name].  [name] [name] stated his sats are in the 90's and he used his duo nebs, Trelegy as recommended by Dr. Annamaria Boots.  [name] [name] stated he has taken Mucinex, because he has had a cough, and felt he had some congestion.  [name] [name] stated Mucinex helped.  I spoke with [name] [name] and Pateint Wife Izora Gala (DPR).  Izora Gala stated [name] [name] is very anxious and can not relax. 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 Izora Gala is requesting something to help [name] [name] relax. Izora Gala request any prescriptions to be sent to [facility].  Allergies  Allergen Reactions   Fenofibrate     Other reaction(s): pancreatitis   Zocor  [Simvastatin]     Other reaction(s): muscle weakness   Current Outpatient Medications on File Prior to Visit  Medication Sig Dispense Refill   albuterol (PROAIR HFA) 108 (90 Base) MCG/ACT inhaler 2 puffs 4 times a day as needed 8 g 12   albuterol (PROVENTIL) (2.5 MG/3ML) 0.083% nebulizer solution USE 1 VIAL (3 ML) VIA NEBULIZATION ONCE OR TWICE A DAY 180 mL 11   ALPRAZolam (XANAX) 1 MG tablet Take 1 mg by mouth at bedtime.      aspirin EC 81 MG tablet Take 1 tablet (81 mg total) by mouth daily. Swallow whole. 30 tablet 11   Budeson-Glycopyrrol-Formoterol (BREZTRI AEROSPHERE) 160-9-4.8 MCG/ACT AERO Inhale 2 puffs into the lungs in the morning and at bedtime. 5.9 g 0   calcium gluconate 500 MG tablet Take 1 tablet by mouth 3 (three) times daily.     CALCIUM-VITAMIN D PO Take 1 tablet by mouth daily.     Cholecalciferol (VITAMIN D) 50 MCG (2000 UT) CAPS Take 2,000 Units by mouth daily.     Fluticasone-Salmeterol (ADVAIR) 250-50 MCG/DOSE AEPB Inhale 1 puff into the lungs every 12 (twelve) hours. 60 each 11   furosemide (LASIX) 20 MG tablet Take 20 mg by mouth daily as needed for edema.      ipratropium (ATROVENT) 0.02 % nebulizer solution INHALE 1 VIAL BY MOUTH VIA NEBULIZER EVERY 8 HOURS AS NEEDED FOR WHEEZING AND FOR SHORTNESS OF BREATH 120  mL 11   ketoconazole (NIZORAL) 2 % cream Apply 1 application topically daily as needed for irritation. Reported on 07/30/2015     nitroGLYCERIN (NITROSTAT) 0.4 MG SL tablet Place 0.4 mg under the tongue at bedtime.     ofloxacin (OCUFLOX) 0.3 % ophthalmic solution Place 1 drop into both eyes as needed.     OXYGEN Inhale into the lungs.     predniSONE (DELTASONE) 5 MG tablet TAKE 2 TO 3 TABLETS BY MOUTH DAILY OR AS INSTRUCED 270 tablet 2   terazosin (HYTRIN) 5 MG capsule Take 5 mg by mouth at bedtime. Once a day     traMADol (ULTRAM) 50 MG tablet Take 50 mg by mouth every 6 (six) hours as needed (pain).      triamcinolone cream (KENALOG) 0.1 % Apply 1 application topically 2 (two) times daily as needed (irritation).      vitamin B-12 (CYANOCOBALAMIN) 1000 MCG tablet Take 1,000 mcg by mouth daily.     No current facility-administered medications on file prior to visit.   Message routed to Dr. Annamaria Boots to advise

## 2021-01-31 NOTE — Telephone Encounter (Signed)
Appt cancelled per 8/4 pt wife request, patient will not be able to make it.

## 2021-02-05 ENCOUNTER — Other Ambulatory Visit: Payer: Self-pay

## 2021-02-05 ENCOUNTER — Inpatient Hospital Stay: Payer: PPO | Admitting: Internal Medicine

## 2021-02-05 ENCOUNTER — Inpatient Hospital Stay: Payer: PPO | Attending: Internal Medicine | Admitting: Internal Medicine

## 2021-02-05 VITALS — BP 98/78 | HR 46 | Temp 97.5°F | Resp 17 | Wt 138.7 lb

## 2021-02-05 DIAGNOSIS — Z79899 Other long term (current) drug therapy: Secondary | ICD-10-CM | POA: Diagnosis not present

## 2021-02-05 DIAGNOSIS — C439 Malignant melanoma of skin, unspecified: Secondary | ICD-10-CM

## 2021-02-05 DIAGNOSIS — C7801 Secondary malignant neoplasm of right lung: Secondary | ICD-10-CM | POA: Diagnosis not present

## 2021-02-05 DIAGNOSIS — C7802 Secondary malignant neoplasm of left lung: Secondary | ICD-10-CM | POA: Insufficient documentation

## 2021-02-05 DIAGNOSIS — Z5112 Encounter for antineoplastic immunotherapy: Secondary | ICD-10-CM | POA: Insufficient documentation

## 2021-02-05 DIAGNOSIS — Z85820 Personal history of malignant melanoma of skin: Secondary | ICD-10-CM | POA: Insufficient documentation

## 2021-02-05 NOTE — Progress Notes (Signed)
START ON PATHWAY REGIMEN - Melanoma and Other Skin Cancers     A cycle is every 21 days:     Pembrolizumab   **Always confirm dose/schedule in your pharmacy ordering system**  Patient Characteristics: Melanoma, Cutaneous/Unknown Primary, Distant Metastases or Unresectable Local Recurrence, Unresectable, Symptomatic, First Line, BRAF V600 Wild Type / BRAF V600 Results Pending or Unknown, Candidate for Immunotherapy Disease Classification: Melanoma Disease Subtype: Cutaneous BRAF V600 Mutation Status: Quantity Not Sufficient Therapeutic Status: Distant Metastases Metastatic Disease Type: Symptomatic Line of Therapy: First Line Immunotherapy Candidate Status: Candidate for Immunotherapy Intent of Therapy: Non-Curative / Palliative Intent, Discussed with Patient

## 2021-02-05 NOTE — Progress Notes (Signed)
[facility] [phone]   [phone]  OFFICE PROGRESS NOTE  [name], MD 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  The patient has evidence for metastatic disease with bilateral pulmonary nodules in July 2022.  PRIOR THERAPY:  1) Status post right ear excision completed on July 07, 2019 under the care of Dr. Marla Roe. 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  CURRENT THERAPY: Keytruda 200 Mg IV every 3 weeks.  First dose 02/12/2021.  INTERVAL HISTORY: [name] [name] 87 y.o. male is a former patient of Dr. Alen Blew who came to me today to establish care and transfer his care to me.  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 Marla Roe.  He was followed by Dr. Alen Blew with observation.  He also had enlarged right cervical lymph node and biopsy showed oncocytic salivary gland tumor.  He had a recent CT scan of the chest on January 11, 2021 and unfortunately it showed diffuse metastatic pulmonary disease with innumerable pulmonary nodules bilaterally.  There was no mediastinal or hilar mass or adenopathy.  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  When seen today he continues to have cough and he is using Mucinex.  He also has back pain and significant weight loss recently.  He has no chest pain or hemoptysis.  He denied having any nausea, vomiting but has alternating diarrhea and  constipation.  He has no headache but has blurry vision. Family history significant for mother with dementia and father had heart disease and died at age 75.  The patient is married and has 2 sons and he was accompanied today by his wife [place].  He has a history of smoking but quit in 1970 he drinks alcohol occasionally and no history of drug abuse.  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  MEDICAL HISTORY: Past Medical History:  Diagnosis Date   Adrenal insufficiency (HCC)    Allergic rhinitis    Aortic stenosis, moderate    Asthma    Chronic sinusitis    COPD (chronic obstructive pulmonary disease) (HCC)    H/O: asbestos exposure    Nasal polyps    PAC (premature atrial contraction)    EKG 12/03/09    ALLERGIES:  is allergic to fenofibrate and zocor  [simvastatin].  MEDICATIONS:  Current Outpatient Medications  Medication Sig Dispense Refill   albuterol (PROAIR HFA) 108 (90 Base) MCG/ACT inhaler 2 puffs 4 times a day as needed 8 g 12   albuterol (PROVENTIL) (2.5 MG/3ML) 0.083% nebulizer solution USE 1 VIAL (3 ML) VIA NEBULIZATION ONCE OR TWICE A DAY 180 mL 11   ALPRAZolam (XANAX) 1 MG tablet Take 1 mg by mouth at bedtime.      aspirin EC 81 MG tablet Take 1 tablet (81 mg total) by mouth daily. Swallow whole. 30 tablet 11   Budeson-Glycopyrrol-Formoterol (BREZTRI AEROSPHERE) 160-9-4.8 MCG/ACT AERO Inhale 2 puffs into the lungs in the morning and at bedtime. 5.9 g 0  calcium gluconate 500 MG tablet Take 1 tablet by mouth 3 (three) times daily.     CALCIUM-VITAMIN D PO Take 1 tablet by mouth daily.     Cholecalciferol (VITAMIN D) 50 MCG (2000 UT) CAPS Take 2,000 Units by mouth daily.     Fluticasone-Salmeterol (ADVAIR) 250-50 MCG/DOSE AEPB Inhale 1 puff into the lungs every 12 (twelve) hours. 60 each 11   furosemide (LASIX) 20 MG tablet Take 20 mg by mouth daily as needed for edema.      ipratropium (ATROVENT) 0.02 % nebulizer solution INHALE 1 VIAL BY MOUTH VIA NEBULIZER EVERY 8 HOURS AS  NEEDED FOR WHEEZING AND FOR SHORTNESS OF BREATH 120 mL 11   ketoconazole (NIZORAL) 2 % cream Apply 1 application topically daily as needed for irritation. Reported on 07/30/2015     nitroGLYCERIN (NITROSTAT) 0.4 MG SL tablet Place 0.4 mg under the tongue at bedtime.     ofloxacin (OCUFLOX) 0.3 % ophthalmic solution Place 1 drop into both eyes as needed.     OXYGEN Inhale into the lungs.     predniSONE (DELTASONE) 5 MG tablet TAKE 2 TO 3 TABLETS BY MOUTH DAILY OR AS INSTRUCED 270 tablet 2   terazosin (HYTRIN) 5 MG capsule Take 5 mg by mouth at bedtime. Once a day     traMADol (ULTRAM) 50 MG tablet Take 50 mg by mouth every 6 (six) hours as needed (pain).      triamcinolone cream (KENALOG) 0.1 % Apply 1 application topically 2 (two) times daily as needed (irritation).      vitamin B-12 (CYANOCOBALAMIN) 1000 MCG tablet Take 1,000 mcg by mouth daily.     No current facility-administered medications for this visit.    SURGICAL HISTORY:  Past Surgical History:  Procedure Laterality Date   HERNIA REPAIR     TONSILLECTOMY      REVIEW OF SYSTEMS:  Constitutional: positive for anorexia, fatigue, and weight loss Eyes: positive for visual disturbance Ears, nose, mouth, throat, and face: negative Respiratory: positive for cough and dyspnea on exertion Cardiovascular: negative Gastrointestinal: positive for constipation and diarrhea Genitourinary:negative Integument/breast: negative Hematologic/lymphatic: negative Musculoskeletal:positive for back pain Neurological: negative Behavioral/Psych: negative Endocrine: negative Allergic/Immunologic: negative   PHYSICAL EXAMINATION: General appearance: alert, cooperative, fatigued, and no distress Head: Normocephalic, without obvious abnormality, atraumatic Neck: no adenopathy, no JVD, supple, symmetrical, trachea midline, and thyroid not enlarged, symmetric, no tenderness/mass/nodules Lymph nodes: Cervical, supraclavicular, and axillary nodes  normal. Resp: clear to auscultation bilaterally Back: symmetric, no curvature. ROM normal. No CVA tenderness. Cardio: regular rate and rhythm, S1, S2 normal, no murmur, click, rub or gallop GI: soft, non-tender; bowel sounds normal; no masses,  no organomegaly Extremities: extremities normal, atraumatic, no cyanosis or edema Neurologic: Alert and oriented X 3, normal strength and tone. Normal symmetric reflexes. Normal coordination and gait  ECOG PERFORMANCE STATUS: 1 - Symptomatic but completely ambulatory  Blood pressure 98/78, pulse (!) 46, temperature (!) 97.5 F (36.4 C), temperature source Oral, resp. rate 17, weight 138 lb 11.2 oz (62.9 kg), SpO2 96 %.  LABORATORY DATA: Lab Results  Component Value Date   WBC 10.3 01/09/2021   HGB 12.2 (L) 01/09/2021   HCT 35.6 (L) 01/09/2021   MCV 93.8 01/09/2021   PLT 172.0 01/09/2021      Chemistry      Component Value Date/Time   NA 137 01/09/2021 1442   K 4.2 01/09/2021 1442   CL 102 01/09/2021 1442   CO2 29 01/09/2021 1442   BUN 21  01/09/2021 1442   CREATININE 0.75 01/09/2021 1442      Component Value Date/Time   CALCIUM 9.5 01/09/2021 1442   ALKPHOS 69 01/09/2021 1442   AST 19 01/09/2021 1442   ALT 16 01/09/2021 1442   BILITOT 0.9 01/09/2021 1442       RADIOGRAPHIC STUDIES: CT Chest W Contrast  Result Date: 01/16/2021 CLINICAL DATA:  History of melanoma with new lung nodules seen on chest x-ray. EXAM: CT CHEST WITH CONTRAST TECHNIQUE: Multidetector CT imaging of the chest was performed during intravenous contrast administration. CONTRAST:  42m ISOVUE-300 IOPAMIDOL (ISOVUE-300) INJECTION 61% COMPARISON:  Chest x-ray 01/03/2021 and prior PET-CT 08/29/2019 FINDINGS: Cardiovascular: The heart is normal in size. No pericardial effusion. There is moderate tortuosity, ectasia and calcification of the thoracic aorta. The branch vessels are patent. Stable coronary artery calcifications. Mediastinum/Nodes: No mediastinal or hilar  mass or lymphadenopathy. The esophagus is grossly normal. Lungs/Pleura: Diffuse metastatic pulmonary disease with innumerable pulmonary nodules bilaterally. Index lesions: 31 mm superior segment right lower lobe nodule image 75/5. 17 mm left lower lobe peripheral nodule on image number 118/5. 23 mm right upper lobe peripheral nodule on image number 55/5. 18 mm left upper lobe nodule centrally on image number 95/5. 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 No pleural effusions. Upper Abdomen: 2 small low-attenuation lesions in the left hepatic lobe consistent with benign hepatic cysts. No worrisome hepatic lesions to suggest metastatic disease. No intrahepatic biliary dilatation. The gallbladder appears normal. No common bile duct dilatation. Scattered calcifications throughout the pancreas likely the due to prior pancreatitis. Simple appearing splenic cyst and simple appearing bilateral renal cysts. No findings for adrenal metastatic disease Advanced vascular calcifications. Musculoskeletal: No chest wall mass, supraclavicular or axillary adenopathy. The thyroid gland is unremarkable. The bony thorax is intact. No findings suspicious for osseous metastatic disease. IMPRESSION: 1. Diffuse metastatic pulmonary disease with innumerable pulmonary nodules bilaterally. Given patient's history, most likely metastatic melanoma. 2. No mediastinal or hilar mass or adenopathy. 3. 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 4. No findings for upper abdominal metastatic disease or osseous metastatic disease. 5. Advanced vascular calcifications. 6. Emphysema and aortic atherosclerosis. Aortic Atherosclerosis (ICD10-I70.0) and Emphysema (ICD10-J43.9). Electronically Signed   By: PMarijo SanesM.D.   On: 01/16/2021 13:59    ASSESSMENT AND PLAN: This is a very pleasant 87years old white male with stage IV (T3b, N1, M1a) metastatic  malignant melanoma, spindle cell type that was initially diagnosed in 2020 as stage III status post excision of the right ear.  The patient has been in observation for the last 2 years. Unfortunately CT scan of the chest performed recently showed diffuse metastatic pulmonary disease with innumerable pulmonary nodules bilaterally. I had a lengthy discussion with the patient and his wife today about his current condition and treatment options.  I explained to the patient that he has incurable condition and all the treatment will be of palliative nature. I recommended for him to complete the staging work-up by ordering whole-body PET scan as well as MRI of the brain to rule out any other metastatic disease. I gave the patient the option of palliative care and hospice referral versus consideration of palliative treatment with immunotherapy with single agent Keytruda 200 Mg IV every 3 weeks.  The patient will not be a great candidate for a combination of ipilimumab and nivolumab as my preferred option for this condition because of his age and other comorbidities. I discussed with the  patient the adverse effect of this treatment including but not limited to immunotherapy mediated skin rash, diarrhea, inflammation of the lung, kidney, liver, thyroid or other endocrine dysfunction. The patient and his wife are interested in treatment with Keytruda. He is expected to start the first cycle of this treatment next week. I will arrange for the patient to have a chemotherapy education class before the first dose of his treatment. I will see him back for follow-up visit in 2 weeks for evaluation and discussion of his imaging studies as well as management of any adverse effect of his treatment. The patient was advised to call immediately if he has any other concerning symptoms in the interval. The patient voices understanding of current disease status and treatment options and is in agreement with the current care  plan.  All questions were answered. The patient knows to call the clinic with any problems, questions or concerns. We can certainly see the patient much sooner if necessary.  The total time spent in the appointment was 60 minutes.  Disclaimer: This note was dictated with voice recognition software. Similar sounding words can inadvertently be transcribed and may not be corrected upon review.

## 2021-02-08 ENCOUNTER — Telehealth: Payer: Self-pay | Admitting: Internal Medicine

## 2021-02-08 NOTE — Telephone Encounter (Signed)
Scheduled appointment per 08/09 los. Patient is aware. 

## 2021-02-11 ENCOUNTER — Ambulatory Visit (HOSPITAL_COMMUNITY): Admission: RE | Admit: 2021-02-11 | Payer: PPO | Source: Ambulatory Visit

## 2021-02-11 ENCOUNTER — Telehealth: Payer: Self-pay

## 2021-02-11 NOTE — Telephone Encounter (Signed)
Pts wife called stating they want to cancel his upcoming appts and that she has cancelled his MRI. She states she doesn't feel they are able to get here because his breathing is so bad and he is extremely tired. I advised her to please take him to the ER if his SOB has worsened. She denies any fever and states he is just really tired and doesn't want to go anywhere. She also states he refuses to go to the ER.  I had a very long conversation with Mrs. [name] [name] regarding [name] [name]' desire for a second opinion, which he had 02/05/21 and his desire to move forward with tx, which has been scheduled for this week. She states they didn't understand he would require so many appointments. I expressed understanding of this and advised we try to schedule as many appts as we can on the same day to lessen the number of trips they have to make here to the [facility] but some of the appts do require some spacing.   I asked if the pt hs changed his mind regarding tx and she wouldn't really answer the question. I also asked to speak with the pt, she advised that he was sleeping but if she could wake him up enough before 4:30p, she would call back but wants to cancel his appts for now and if/when he is ready to r/s them, they will call us back. I also shared that certain appts may take some time to r/s and she expressed understanding of this. I advised, for now, we will leave his appts as it and she can let us know the day of, if they will choose to come or not.

## 2021-02-12 ENCOUNTER — Inpatient Hospital Stay: Payer: PPO

## 2021-02-12 ENCOUNTER — Other Ambulatory Visit: Payer: Self-pay

## 2021-02-12 ENCOUNTER — Encounter: Payer: Self-pay | Admitting: Internal Medicine

## 2021-02-12 NOTE — Progress Notes (Signed)
Met with patient at registration to introduce myself as Arboriculturist and to offer available resources.  Discussed one-time $1000 Radio broadcast assistant to assist with personal expenses while going through treatment.  Gave him my card if interested in applying and for any additional financial questions or concerns.

## 2021-02-13 ENCOUNTER — Inpatient Hospital Stay: Payer: PPO | Admitting: Internal Medicine

## 2021-02-13 ENCOUNTER — Inpatient Hospital Stay: Payer: PPO

## 2021-02-13 ENCOUNTER — Encounter: Payer: Self-pay | Admitting: Internal Medicine

## 2021-02-13 ENCOUNTER — Other Ambulatory Visit: Payer: Self-pay

## 2021-02-13 VITALS — BP 108/68 | HR 88 | Temp 97.3°F | Resp 18 | Ht 69.0 in | Wt 144.6 lb

## 2021-02-13 DIAGNOSIS — Z5112 Encounter for antineoplastic immunotherapy: Secondary | ICD-10-CM | POA: Diagnosis not present

## 2021-02-13 DIAGNOSIS — C439 Malignant melanoma of skin, unspecified: Secondary | ICD-10-CM

## 2021-02-13 LAB — CMP (CANCER CENTER ONLY)
ALT: 15 U/L (ref 0–44)
AST: 18 U/L (ref 15–41)
Albumin: 3 g/dL — ABNORMAL LOW (ref 3.5–5.0)
Alkaline Phosphatase: 59 U/L (ref 38–126)
Anion gap: 9 (ref 5–15)
BUN: 14 mg/dL (ref 8–23)
CO2: 28 mmol/L (ref 22–32)
Calcium: 8.8 mg/dL — ABNORMAL LOW (ref 8.9–10.3)
Chloride: 102 mmol/L (ref 98–111)
Creatinine: 0.87 mg/dL (ref 0.61–1.24)
GFR, Estimated: >60 mL/min (ref >60)
Glucose, Bld: 89 mg/dL (ref 70–99)
Potassium: 4 mmol/L (ref 3.5–5.1)
Sodium: 139 mmol/L (ref 135–145)
Total Bilirubin: 0.5 mg/dL (ref 0.3–1.2)
Total Protein: 5.6 g/dL — ABNORMAL LOW (ref 6.5–8.1)

## 2021-02-13 LAB — CBC WITH DIFFERENTIAL (CANCER CENTER ONLY)
Abs Immature Granulocytes: 0.02 10*3/uL (ref 0.00–0.07)
Basophils Absolute: 0 10*3/uL (ref 0.0–0.1)
Basophils Relative: 0 %
Eosinophils Absolute: 0.3 10*3/uL (ref 0.0–0.5)
Eosinophils Relative: 4 %
HCT: 34.6 % — ABNORMAL LOW (ref 39.0–52.0)
Hemoglobin: 11.1 g/dL — ABNORMAL LOW (ref 13.0–17.0)
Immature Granulocytes: 0 %
Lymphocytes Relative: 10 %
Lymphs Abs: 0.8 10*3/uL (ref 0.7–4.0)
MCH: 30.9 pg (ref 26.0–34.0)
MCHC: 32.1 g/dL (ref 30.0–36.0)
MCV: 96.4 fL (ref 80.0–100.0)
Monocytes Absolute: 0.8 10*3/uL (ref 0.1–1.0)
Monocytes Relative: 10 %
Neutro Abs: 6.2 10*3/uL (ref 1.7–7.7)
Neutrophils Relative %: 76 %
Platelet Count: 203 10*3/uL (ref 150–400)
RBC: 3.59 MIL/uL — ABNORMAL LOW (ref 4.22–5.81)
RDW: 14.1 % (ref 11.5–15.5)
WBC Count: 8.1 10*3/uL (ref 4.0–10.5)
nRBC: 0 % (ref 0.0–0.2)

## 2021-02-13 LAB — TSH: TSH: 2.487 u[IU]/mL (ref 0.320–4.118)

## 2021-02-13 MED ORDER — SODIUM CHLORIDE 0.9 % IV SOLN
200.0000 mg | Freq: Once | INTRAVENOUS | Status: AC
  Administered 2021-02-13: 200 mg via INTRAVENOUS
  Filled 2021-02-13: qty 8

## 2021-02-13 MED ORDER — SODIUM CHLORIDE 0.9 % IV SOLN: Freq: Once | INTRAVENOUS | Status: AC

## 2021-02-13 NOTE — Progress Notes (Signed)
[facility] [phone]   [phone]  OFFICE PROGRESS NOTE  [name], MD 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  The patient has evidence for metastatic disease with bilateral pulmonary nodules in July 2022.  PRIOR THERAPY:  1) Status post right ear excision completed on July 07, 2019 under the care of Dr. Marla Roe. 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  CURRENT THERAPY: Keytruda 200 Mg IV every 3 weeks.  First dose 02/13/2021.  INTERVAL HISTORY: [name] [name] 87 y.o. male returns to the clinic today for follow-up visit accompanied by his wife.  The patient is feeling fine today with no concerning complaints except for the generalized fatigue and weakness as well as shortness of breath at baseline increased with exertion.  He denied having any chest pain but has mild cough with no hemoptysis.  He denied having any fever or chills.  He has no nausea, vomiting, diarrhea or constipation.  He has no headache or visual changes.  He is here today to start the first cycle of his treatment with immunotherapy with Keytruda.  He is a scheduled for a PET scan next week but his MRI of the brain has not been scheduled yet.  MEDICAL HISTORY: Past Medical History:  Diagnosis Date   Adrenal insufficiency (HCC)    Allergic rhinitis    Aortic stenosis, moderate    Asthma    Chronic sinusitis    COPD (chronic obstructive pulmonary disease) (HCC)    H/O: asbestos exposure    Nasal polyps    PAC (premature atrial contraction)    EKG 12/03/09    ALLERGIES:  is allergic to fenofibrate and zocor  [simvastatin].  MEDICATIONS:  Current Outpatient Medications   Medication Sig Dispense Refill   albuterol (PROAIR HFA) 108 (90 Base) MCG/ACT inhaler 2 puffs 4 times a day as needed 8 g 12   albuterol (PROVENTIL) (2.5 MG/3ML) 0.083% nebulizer solution USE 1 VIAL (3 ML) VIA NEBULIZATION ONCE OR TWICE A DAY 180 mL 11   ALPRAZolam (XANAX) 1 MG tablet Take 1 mg by mouth at bedtime.      aspirin EC 81 MG tablet Take 1 tablet (81 mg total) by mouth daily. Swallow whole. 30 tablet 11   Budeson-Glycopyrrol-Formoterol (BREZTRI AEROSPHERE) 160-9-4.8 MCG/ACT AERO Inhale 2 puffs into the lungs in the morning and at bedtime. (Patient not taking: Reported on 02/05/2021) 5.9 g 0   calcium gluconate 500 MG tablet Take 1 tablet by mouth 3 (three) times daily.     CALCIUM-VITAMIN D PO Take 1 tablet by mouth daily.     Cholecalciferol (VITAMIN D) 50 MCG (2000 UT) CAPS Take 2,000 Units by mouth daily.     Fluticasone-Salmeterol (ADVAIR) 250-50 MCG/DOSE AEPB Inhale 1 puff into the lungs every 12 (twelve) hours. 60 each 11   furosemide (LASIX) 20 MG tablet Take 20 mg by mouth daily as needed for edema.      ipratropium (ATROVENT) 0.02 % nebulizer solution INHALE 1 VIAL BY MOUTH VIA NEBULIZER EVERY 8 HOURS AS NEEDED FOR WHEEZING AND FOR SHORTNESS OF BREATH 120 mL 11   ketoconazole (NIZORAL) 2 % cream Apply 1 application topically daily as needed for irritation. Reported on 07/30/2015  nitroGLYCERIN (NITROSTAT) 0.4 MG SL tablet Place 0.4 mg under the tongue at bedtime.     ofloxacin (OCUFLOX) 0.3 % ophthalmic solution Place 1 drop into both eyes as needed.     OXYGEN Inhale into the lungs.     predniSONE (DELTASONE) 5 MG tablet TAKE 2 TO 3 TABLETS BY MOUTH DAILY OR AS INSTRUCED 270 tablet 2   terazosin (HYTRIN) 5 MG capsule Take 5 mg by mouth at bedtime. Once a day     traMADol (ULTRAM) 50 MG tablet Take 50 mg by mouth every 6 (six) hours as needed (pain).      triamcinolone cream (KENALOG) 0.1 % Apply 1 application topically 2 (two) times daily as needed (irritation).       vitamin B-12 (CYANOCOBALAMIN) 1000 MCG tablet Take 1,000 mcg by mouth daily.     No current facility-administered medications for this visit.    SURGICAL HISTORY:  Past Surgical History:  Procedure Laterality Date   HERNIA REPAIR     TONSILLECTOMY      REVIEW OF SYSTEMS:  A comprehensive review of systems was negative except for: Constitutional: positive for fatigue and weight loss Respiratory: positive for cough and dyspnea on exertion Musculoskeletal: positive for muscle weakness   PHYSICAL EXAMINATION: General appearance: alert, cooperative, fatigued, and no distress Head: Normocephalic, without obvious abnormality, atraumatic Neck: no adenopathy, no JVD, supple, symmetrical, trachea midline, and thyroid not enlarged, symmetric, no tenderness/mass/nodules Lymph nodes: Cervical, supraclavicular, and axillary nodes normal. Resp: clear to auscultation bilaterally Back: symmetric, no curvature. ROM normal. No CVA tenderness. Cardio: regular rate and rhythm, S1, S2 normal, no murmur, click, rub or gallop GI: soft, non-tender; bowel sounds normal; no masses,  no organomegaly Extremities: extremities normal, atraumatic, no cyanosis or edema  ECOG PERFORMANCE STATUS: 1 - Symptomatic but completely ambulatory  Blood pressure 108/68, pulse 88, temperature (!) 97.3 F (36.3 C), temperature source Tympanic, resp. rate 18, height '5\' 9"'$  (1.753 m), weight 144 lb 9.6 oz (65.6 kg), SpO2 98 %.   LABORATORY DATA: Lab Results  Component Value Date   WBC 8.1 02/13/2021   HGB 11.1 (L) 02/13/2021   HCT 34.6 (L) 02/13/2021   MCV 96.4 02/13/2021   PLT 203 02/13/2021      Chemistry      Component Value Date/Time   NA 137 01/09/2021 1442   K 4.2 01/09/2021 1442   CL 102 01/09/2021 1442   CO2 29 01/09/2021 1442   BUN 21 01/09/2021 1442   CREATININE 0.75 01/09/2021 1442      Component Value Date/Time   CALCIUM 9.5 01/09/2021 1442   ALKPHOS 69 01/09/2021 1442   AST 19 01/09/2021 1442    ALT 16 01/09/2021 1442   BILITOT 0.9 01/09/2021 1442       RADIOGRAPHIC STUDIES: No results found.  ASSESSMENT AND PLAN: This is a very pleasant 87 years old white male with stage IV (T3b, N1, M1a) metastatic malignant melanoma, spindle cell type that was initially diagnosed in 2020 as stage III status post excision of the right ear.  The patient has been in observation for the last 2 years. Unfortunately CT scan of the chest performed recently showed diffuse metastatic pulmonary disease with innumerable pulmonary nodules bilaterally. His staging work-up is still pending including the PET scan and MRI of the brain. I gave the patient the option of palliative care and hospice referral versus consideration of palliative treatment with immunotherapy with single agent Keytruda 200 Mg IV every 3 weeks.  The patient  will not be a great candidate for a combination of ipilimumab and nivolumab as my preferred option for this condition because of his age and other comorbidities. The patient decided to proceed with the treatment with immunotherapy with Keytruda 200 Mg IV every 3 weeks.  First dose today 02/13/2021. I recommended for the patient to proceed with the treatment today as planned. I will see him back for follow-up visit in 3 weeks for evaluation before the next cycle of his treatment. The patient was advised to call immediately if he has any other concerning symptoms in the interval. The patient voices understanding of current disease status and treatment options and is in agreement with the current care plan.  All questions were answered. The patient knows to call the clinic with any problems, questions or concerns. We can certainly see the patient much sooner if necessary.  Disclaimer: This note was dictated with voice recognition software. Similar sounding words can inadvertently be transcribed and may not be corrected upon review.

## 2021-02-13 NOTE — Patient Instructions (Signed)
[facility]  Discharge Instructions: Thank you for choosing [facility] to provide your oncology and hematology care.   If you have a lab appointment with the Cancer Center, please go directly to the Cancer Center and check in at the registration area.   Wear comfortable clothing and clothing appropriate for easy access to any Portacath or PICC line.   We strive to give you quality time with your provider. You may need to reschedule your appointment if you arrive late (15 or more minutes).  Arriving late affects you and other patients whose appointments are after yours.  Also, if you miss three or more appointments without notifying the office, you may be dismissed from the clinic at the provider's discretion.      For prescription refill requests, have your pharmacy contact our office and allow 72 hours for refills to be completed.    Today you received the following chemotherapy and/or immunotherapy agents Keytruda      To help prevent nausea and vomiting after your treatment, we encourage you to take your nausea medication as directed.  BELOW ARE SYMPTOMS THAT SHOULD BE REPORTED IMMEDIATELY: *FEVER GREATER THAN 100.4 F (38 C) OR HIGHER *CHILLS OR SWEATING *NAUSEA AND VOMITING THAT IS NOT CONTROLLED WITH YOUR NAUSEA MEDICATION *UNUSUAL SHORTNESS OF BREATH *UNUSUAL BRUISING OR BLEEDING *URINARY PROBLEMS (pain or burning when urinating, or frequent urination) *BOWEL PROBLEMS (unusual diarrhea, constipation, pain near the anus) TENDERNESS IN MOUTH AND THROAT WITH OR WITHOUT PRESENCE OF ULCERS (sore throat, sores in mouth, or a toothache) UNUSUAL RASH, SWELLING OR PAIN  UNUSUAL VAGINAL DISCHARGE OR ITCHING   Items with * indicate a potential emergency and should be followed up as soon as possible or go to the Emergency Department if any problems should occur.  Please show the CHEMOTHERAPY ALERT CARD or IMMUNOTHERAPY ALERT CARD at check-in to  the Emergency Department and triage nurse.  Should you have questions after your visit or need to cancel or reschedule your appointment, please contact [facility]  Dept: [phone]  and follow the prompts.  Office hours are 8:00 a.m. to 4:30 p.m. Monday - Friday. Please note that voicemails left after 4:00 p.m. may not be returned until the following business day.  We are closed weekends and major holidays. You have access to a nurse at all times for urgent questions. Please call the main number to the clinic Dept: [phone] and follow the prompts.   For any non-urgent questions, you may also contact your provider using MyChart. We now offer e-Visits for anyone 18 and older to request care online for non-urgent symptoms. For details visit [email or web address].   Also download the MyChart app! Go to the app store, search "MyChart", open the app, select [facility], and log in with your MyChart username and password.  Due to Covid, a mask is required upon entering the hospital/clinic. If you do not have a mask, one will be given to you upon arrival. For doctor visits, patients may have 1 support person aged 18 or older with them. For treatment visits, patients cannot have anyone with them due to current Covid guidelines and our immunocompromised population.   Pembrolizumab injection What is this medication? PEMBROLIZUMAB (pem broe liz ue mab) is a monoclonal antibody. It is used totreat certain types of cancer. This medicine may be used for other purposes; ask your health care provider orpharmacist if you have questions. COMMON BRAND NAME(S): Keytruda What should I   tell my care team before I take this medication? They need to know if you have any of these conditions: autoimmune diseases like Crohn's disease, ulcerative colitis, or lupus have had or planning to have an allogeneic stem cell transplant (uses someone else's stem cells) history of organ  transplant history of chest radiation nervous system problems like myasthenia gravis or Guillain-Barre syndrome an unusual or allergic reaction to pembrolizumab, other medicines, foods, dyes, or preservatives pregnant or trying to get pregnant breast-feeding How should I use this medication? This medicine is for infusion into a vein. It is given by a health careprofessional in a hospital or clinic setting. A special MedGuide will be given to you before each treatment. Be sure to readthis information carefully each time. Talk to your pediatrician regarding the use of this medicine in children. While this drug may be prescribed for children as young as 6 months for selectedconditions, precautions do apply. Overdosage: If you think you have taken too much of this medicine contact apoison control center or emergency room at once. NOTE: This medicine is only for you. Do not share this medicine with others. What if I miss a dose? It is important not to miss your dose. Call your doctor or health careprofessional if you are unable to keep an appointment. What may interact with this medication? Interactions have not been studied. This list may not describe all possible interactions. Give your health care provider a list of all the medicines, herbs, non-prescription drugs, or dietary supplements you use. Also tell them if you smoke, drink alcohol, or use illegaldrugs. Some items may interact with your medicine. What should I watch for while using this medication? Your condition will be monitored carefully while you are receiving thismedicine. You may need blood work done while you are taking this medicine. Do not become pregnant while taking this medicine or for 4 months after stopping it. Women should inform their doctor if they wish to become pregnant or think they might be pregnant. There is a potential for serious side effects to an unborn child. Talk to your health care professional or pharmacist for  more information. Do not breast-feed an infant while taking this medicine orfor 4 months after the last dose. What side effects may I notice from receiving this medication? Side effects that you should report to your doctor or health care professionalas soon as possible: allergic reactions like skin rash, itching or hives, swelling of the face, lips, or tongue bloody or black, tarry breathing problems changes in vision chest pain chills confusion constipation cough diarrhea dizziness or feeling faint or lightheaded fast or irregular heartbeat fever flushing joint pain low blood counts - this medicine may decrease the number of white blood cells, red blood cells and platelets. You may be at increased risk for infections and bleeding. muscle pain muscle weakness pain, tingling, numbness in the hands or feet persistent headache redness, blistering, peeling or loosening of the skin, including inside the mouth signs and symptoms of high blood sugar such as dizziness; dry mouth; dry skin; fruity breath; nausea; stomach pain; increased hunger or thirst; increased urination signs and symptoms of kidney injury like trouble passing urine or change in the amount of urine signs and symptoms of liver injury like dark urine, light-colored stools, loss of appetite, nausea, right upper belly pain, yellowing of the eyes or skin sweating swollen lymph nodes weight loss Side effects that usually do not require medical attention (report to yourdoctor or health care professional if they continue   or are bothersome): decreased appetite hair loss tiredness This list may not describe all possible side effects. Call your doctor for medical advice about side effects. You may report side effects to FDA at1-800-FDA-1088. Where should I keep my medication? This drug is given in a hospital or clinic and will not be stored at home. NOTE: This sheet is a summary. It may not cover all possible information. If you  have questions about this medicine, talk to your doctor, pharmacist, orhealth care provider.  2022 Elsevier/Gold Standard (2019-05-18 21:44:53)   

## 2021-02-18 ENCOUNTER — Ambulatory Visit: Payer: PPO | Admitting: Cardiovascular Disease

## 2021-02-18 ENCOUNTER — Telehealth: Payer: Self-pay | Admitting: Medical Oncology

## 2021-02-18 NOTE — Telephone Encounter (Signed)
Appt 02/19/21- wife is sick and has appt that day  Appt r/s to  wed.

## 2021-02-19 ENCOUNTER — Telehealth: Payer: Self-pay | Admitting: Internal Medicine

## 2021-02-19 ENCOUNTER — Inpatient Hospital Stay: Payer: PPO | Admitting: Internal Medicine

## 2021-02-19 NOTE — Telephone Encounter (Signed)
ATC x2 (home #), remains busy.  ATC x1 mobile # and there is no VM set up.

## 2021-02-19 NOTE — Telephone Encounter (Signed)
ATC x1, line was busy.

## 2021-02-20 ENCOUNTER — Other Ambulatory Visit: Payer: Self-pay

## 2021-02-20 ENCOUNTER — Ambulatory Visit (HOSPITAL_COMMUNITY)
Admission: RE | Admit: 2021-02-20 | Discharge: 2021-02-20 | Disposition: A | Discharge status: Home / Self Care | Payer: PPO | Source: Ambulatory Visit | Attending: Internal Medicine | Admitting: Internal Medicine

## 2021-02-20 DIAGNOSIS — C439 Malignant melanoma of skin, unspecified: Secondary | ICD-10-CM | POA: Insufficient documentation

## 2021-02-20 DIAGNOSIS — Z79899 Other long term (current) drug therapy: Secondary | ICD-10-CM | POA: Diagnosis not present

## 2021-02-20 LAB — GLUCOSE, CAPILLARY: Glucose-Capillary: 101 mg/dL — ABNORMAL HIGH (ref 70–99)

## 2021-02-20 MED ORDER — FLUDEOXYGLUCOSE F - 18 (FDG) INJECTION
7.5000 | Freq: Once | INTRAVENOUS | Status: AC
  Administered 2021-02-20: 7.75 via INTRAVENOUS

## 2021-02-20 NOTE — Telephone Encounter (Signed)
Per protocol and several unsuccessful attempts to get a hold of patient will close this encounter.

## 2021-02-20 NOTE — Telephone Encounter (Signed)
Called pt's home number and the line was busy again.  Called pt's cell and there was no answer and VM not set up yet.

## 2021-02-21 ENCOUNTER — Telehealth: Payer: Self-pay | Admitting: Internal Medicine

## 2021-02-21 ENCOUNTER — Other Ambulatory Visit: Payer: Self-pay

## 2021-02-21 ENCOUNTER — Inpatient Hospital Stay (HOSPITAL_BASED_OUTPATIENT_CLINIC_OR_DEPARTMENT_OTHER): Payer: PPO | Admitting: Internal Medicine

## 2021-02-21 VITALS — BP 102/72 | HR 94 | Temp 96.7°F | Resp 19 | Ht 69.0 in | Wt 143.3 lb

## 2021-02-21 DIAGNOSIS — C439 Malignant melanoma of skin, unspecified: Secondary | ICD-10-CM

## 2021-02-21 DIAGNOSIS — Z5112 Encounter for antineoplastic immunotherapy: Secondary | ICD-10-CM

## 2021-02-21 MED ORDER — FLUTICASONE-SALMETEROL 250-50 MCG/ACT IN AEPB: 1.0000 | INHALATION_SPRAY | Freq: Two times a day (BID) | RESPIRATORY_TRACT | 5 refills | Status: AC

## 2021-02-21 NOTE — Progress Notes (Signed)
[place] [phone]   [phone]  OFFICE PROGRESS NOTE  [name], MD 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  The patient has evidence for metastatic disease with bilateral pulmonary nodules in July 2022.  PRIOR THERAPY:  1) Status post right ear excision completed on July 07, 2019 under the care of Dr. Marla Roe. 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  CURRENT THERAPY: Keytruda 200 Mg IV every 3 weeks.  First dose 02/13/2021.  Status post 1 cycle of treatment.  INTERVAL HISTORY: [name] [name] 87 y.o. male returns to the clinic today for follow-up visit accompanied by his wife.  The patient continues to complain of increasing fatigue and weakness as well as the swelling of the lower extremities.  He also has baseline shortness of breath increased with exertion but no significant chest pain, cough or hemoptysis.  He denied having any fever or chills.  He has no nausea, vomiting, diarrhea or constipation.  He tolerated the first week of his treatment fairly well.  He had a PET scan performed yesterday but unfortunately the final report is not available yet.  MEDICAL HISTORY: Past Medical History:  Diagnosis Date   Adrenal insufficiency (HCC)    Allergic rhinitis    Aortic stenosis, moderate    Asthma    Chronic sinusitis    COPD (chronic obstructive pulmonary disease) (HCC)    H/O: asbestos exposure    Nasal polyps    PAC (premature atrial contraction)    EKG 12/03/09    ALLERGIES:  is allergic to fenofibrate and zocor  [simvastatin].  MEDICATIONS:  Current Outpatient Medications  Medication Sig Dispense Refill   albuterol (PROAIR  HFA) 108 (90 Base) MCG/ACT inhaler 2 puffs 4 times a day as needed 8 g 12   albuterol (PROVENTIL) (2.5 MG/3ML) 0.083% nebulizer solution USE 1 VIAL (3 ML) VIA NEBULIZATION ONCE OR TWICE A DAY 180 mL 11   ALPRAZolam (XANAX) 1 MG tablet Take 1 mg by mouth at bedtime.      aspirin EC 81 MG tablet Take 1 tablet (81 mg total) by mouth daily. Swallow whole. 30 tablet 11   BREO ELLIPTA 100-25 MCG/INH AEPB Inhale 1 puff into the lungs daily.     Budeson-Glycopyrrol-Formoterol (BREZTRI AEROSPHERE) 160-9-4.8 MCG/ACT AERO Inhale 2 puffs into the lungs in the morning and at bedtime. 5.9 g 0   calcium gluconate 500 MG tablet Take 1 tablet by mouth 3 (three) times daily.     CALCIUM-VITAMIN D PO Take 1 tablet by mouth daily.     Cholecalciferol (VITAMIN D) 50 MCG (2000 UT) CAPS Take 2,000 Units by mouth daily.     Fluticasone-Salmeterol (ADVAIR) 250-50 MCG/DOSE AEPB Inhale 1 puff into the lungs every 12 (twelve) hours. 60 each 11   ipratropium (ATROVENT) 0.02 % nebulizer solution INHALE 1 VIAL BY MOUTH VIA NEBULIZER EVERY 8 HOURS AS NEEDED FOR WHEEZING AND FOR SHORTNESS OF BREATH 120 mL 11   ketoconazole (NIZORAL) 2 % cream Apply 1 application topically daily as needed for irritation. Reported on 07/30/2015     nitroGLYCERIN (NITROSTAT) 0.4 MG SL tablet Place 0.4 mg under the tongue at bedtime.     ofloxacin (OCUFLOX) 0.3 % ophthalmic  solution Place 1 drop into both eyes as needed.     OXYGEN Inhale into the lungs.     predniSONE (DELTASONE) 5 MG tablet TAKE 2 TO 3 TABLETS BY MOUTH DAILY OR AS INSTRUCED (Patient not taking: Reported on 02/13/2021) 270 tablet 2   terazosin (HYTRIN) 5 MG capsule Take 5 mg by mouth at bedtime. Once a day     traMADol (ULTRAM) 50 MG tablet Take 50 mg by mouth every 6 (six) hours as needed (pain).      triamcinolone cream (KENALOG) 0.1 % Apply 1 application topically 2 (two) times daily as needed (irritation).      vitamin B-12 (CYANOCOBALAMIN) 1000 MCG tablet Take 1,000 mcg by mouth  daily.     No current facility-administered medications for this visit.    SURGICAL HISTORY:  Past Surgical History:  Procedure Laterality Date   HERNIA REPAIR     TONSILLECTOMY      REVIEW OF SYSTEMS:  A comprehensive review of systems was negative except for: Constitutional: positive for fatigue and weight loss Respiratory: positive for cough and dyspnea on exertion Musculoskeletal: positive for muscle weakness   PHYSICAL EXAMINATION: General appearance: alert, cooperative, fatigued, and no distress Head: Normocephalic, without obvious abnormality, atraumatic Neck: no adenopathy, no JVD, supple, symmetrical, trachea midline, and thyroid not enlarged, symmetric, no tenderness/mass/nodules Lymph nodes: Cervical, supraclavicular, and axillary nodes normal. Resp: clear to auscultation bilaterally Back: symmetric, no curvature. ROM normal. No CVA tenderness. Cardio: regular rate and rhythm, S1, S2 normal, no murmur, click, rub or gallop GI: soft, non-tender; bowel sounds normal; no masses,  no organomegaly Extremities: edema 2+ edema.  ECOG PERFORMANCE STATUS: 1 - Symptomatic but completely ambulatory  Blood pressure 102/72, pulse 94, temperature (!) 96.7 F (35.9 C), temperature source Tympanic, resp. rate 19, height '5\' 9"'$  (1.753 m), weight 143 lb 4.8 oz (65 kg), SpO2 98 %.   LABORATORY DATA: Lab Results  Component Value Date   WBC 8.1 02/13/2021   HGB 11.1 (L) 02/13/2021   HCT 34.6 (L) 02/13/2021   MCV 96.4 02/13/2021   PLT 203 02/13/2021      Chemistry      Component Value Date/Time   NA 139 02/13/2021 1132   K 4.0 02/13/2021 1132   CL 102 02/13/2021 1132   CO2 28 02/13/2021 1132   BUN 14 02/13/2021 1132   CREATININE 0.87 02/13/2021 1132      Component Value Date/Time   CALCIUM 8.8 (L) 02/13/2021 1132   ALKPHOS 59 02/13/2021 1132   AST 18 02/13/2021 1132   ALT 15 02/13/2021 1132   BILITOT 0.5 02/13/2021 1132       RADIOGRAPHIC STUDIES: No results  found.  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  The patient has been in observation for the last 2 years. Unfortunately CT scan of the chest performed recently showed diffuse metastatic pulmonary disease with innumerable pulmonary nodules bilaterally. His staging work-up is still pending including the PET scan and MRI of the brain. I gave the patient the option of palliative care and hospice referral versus consideration of palliative treatment with immunotherapy with single agent Keytruda 200 Mg IV every 3 weeks.  The patient will not be a great candidate for a combination of ipilimumab and nivolumab as my preferred option for this condition  because of his age and other comorbidities. The patient decided to proceed with the treatment with immunotherapy with Keytruda 200 Mg IV every 3 weeks.  First dose today 02/13/2021.  Status post 1 cycle. The patient is feeling fine today with no concerning complaints except for the fatigue and shortness of breath at baseline increased with exertion and swelling of the lower extremities. I recommended for the patient to continue his current treatment with single agent Keytruda but the patient and his wife are also thinking about calling the treatment off and consider hospice at some point.  He will come back for follow-up visit in 2 weeks for evaluation before the next cycle of his treatment unless he decided to discontinue the treatment and moved to hospice. He had a PET scan yesterday but the final report is still pending.  I reviewed the imaging and there is clear evidence for widely metastatic disease. The patient was advised to call immediately if he has any other concerning symptoms in the interval. The patient voices understanding of current disease status and  treatment options and is in agreement with the current care plan.  All questions were answered. The patient knows to call the clinic with any problems, questions or concerns. We can certainly see the patient much sooner if necessary.  Disclaimer: This note was dictated with voice recognition software. Similar sounding words can inadvertently be transcribed and may not be corrected upon review.

## 2021-02-21 NOTE — Telephone Encounter (Signed)
Call made to patient, confirmed DOB. Patient reports he does not feel like trelegy is helping at all. He states he is using his nebulizer 2x/day and trelegy daily. He states after using it nothing changes, I still feel tightness and heaviness in my chest. Denies fever, cough, sweats, chills, or body aches. He reports that CY mentioned trying him on advair. Denies any other symptoms.   CY please advise if you would like patient to try advair.

## 2021-02-21 NOTE — Telephone Encounter (Signed)
Call made to patient, confirmed DOB. Made aware of CY recommendations. Confirmed pharmacy. Medication sent. Voiced understanding.   Nothing further needed at this time.

## 2021-02-21 NOTE — Telephone Encounter (Signed)
Offer Advair 250 # 1, inhale 1 puff then rinse mouth, once daily   Try this instead of Trelegy.

## 2021-02-26 ENCOUNTER — Telehealth: Payer: Self-pay | Admitting: *Deleted

## 2021-02-26 ENCOUNTER — Ambulatory Visit: Payer: PPO | Admitting: Internal Medicine

## 2021-02-26 NOTE — Telephone Encounter (Signed)
Retrieved  two vm calls late from pt's wife today.  She states that she wants Hospice referral & can't bring pt into office now.  She would like someone to call her back tomorrow.  Message routed to Dr Mohamed/Pod RN

## 2021-02-27 ENCOUNTER — Telehealth: Payer: Self-pay | Admitting: Medical Oncology

## 2021-02-27 NOTE — Telephone Encounter (Signed)
Returned wife's call.She requested Hospice. I talked to [name] [name] and he said he is weak and does not feel like he can come in for anymore treatment and he would like to have Hospice services.   [facility] referral made and   Dr Julien Nordmann will be attending.

## 2021-03-05 ENCOUNTER — Inpatient Hospital Stay: Payer: PPO | Admitting: Oncology

## 2021-03-06 ENCOUNTER — Inpatient Hospital Stay: Payer: PPO | Admitting: Internal Medicine

## 2021-03-06 ENCOUNTER — Inpatient Hospital Stay: Payer: PPO

## 2021-04-11 ENCOUNTER — Telehealth: Payer: Self-pay | Admitting: Internal Medicine

## 2021-04-11 NOTE — Telephone Encounter (Signed)
ATC line busy 

## 2021-04-12 NOTE — Telephone Encounter (Signed)
ATC again but the line is busy x 2

## 2021-04-17 NOTE — Telephone Encounter (Signed)
Called patient but the line was busy. Letter has been sent to patient. Will close encounter.

## 2021-06-07 ENCOUNTER — Telehealth: Payer: Self-pay

## 2021-06-16 IMAGING — US US BIOPSY FNA W/ IMAGING
1 series · 7 of 7 positions shown · non-contrast
Comparison: none

INDICATION: 89-year-old male with a history cervical lymph node

[Series 1: us biopsy fna w/ imaging · 7 acquisitions, 7 frames shown]
[im 1/7]
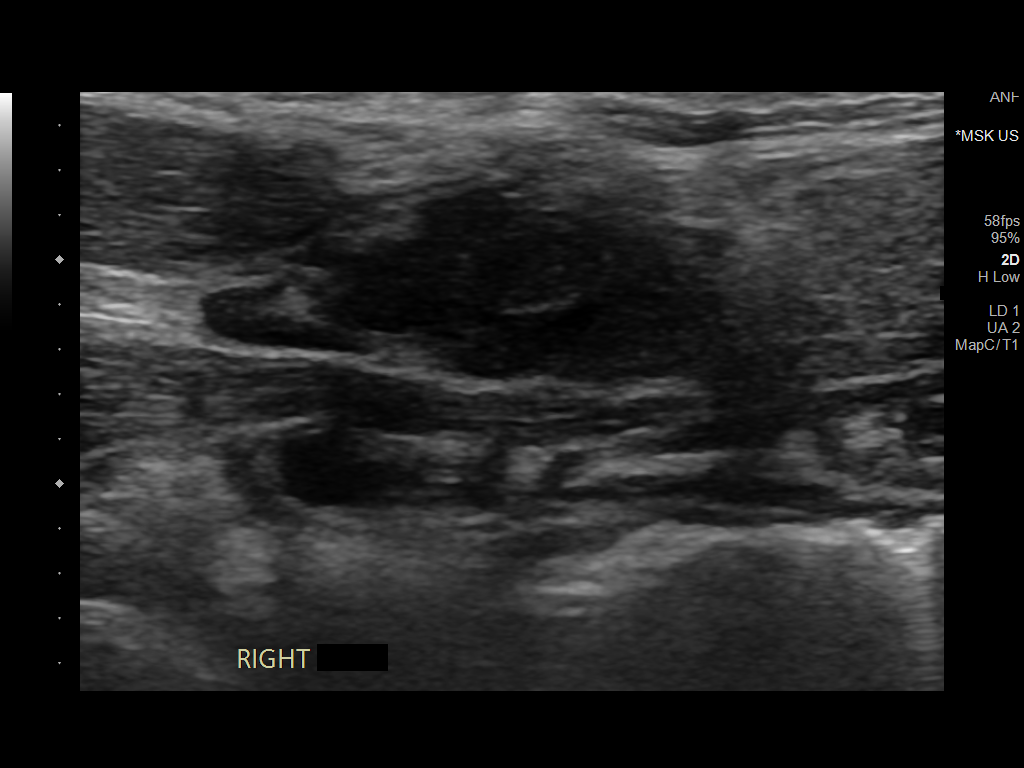
[im 2/7]
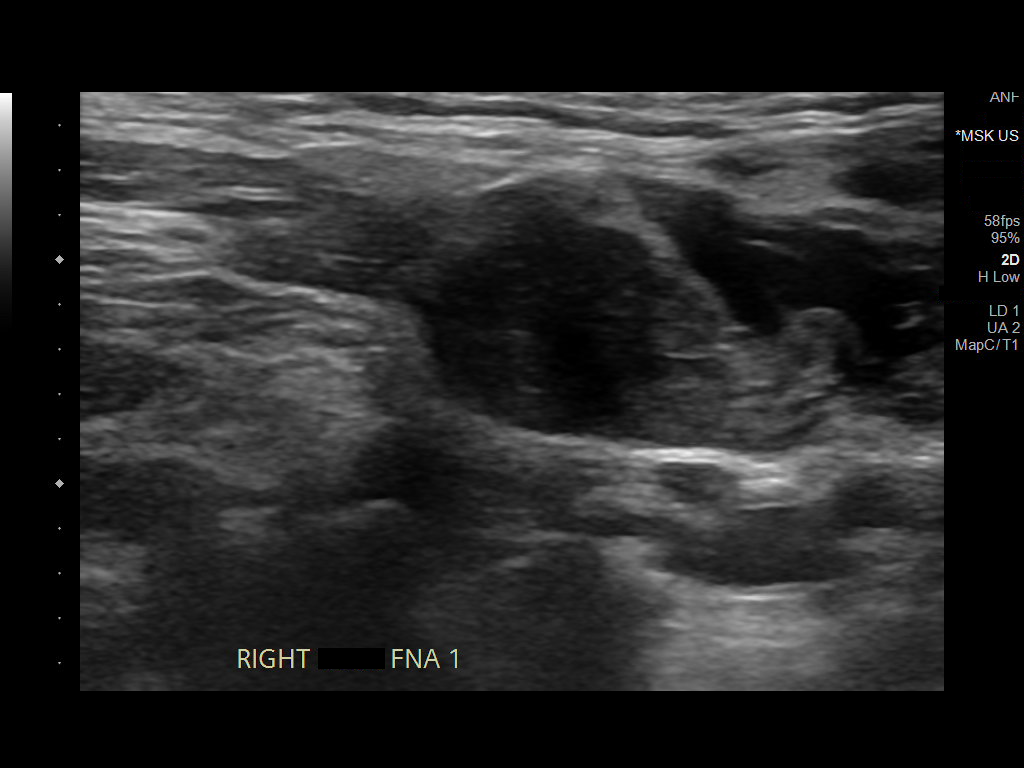
[im 3/7]
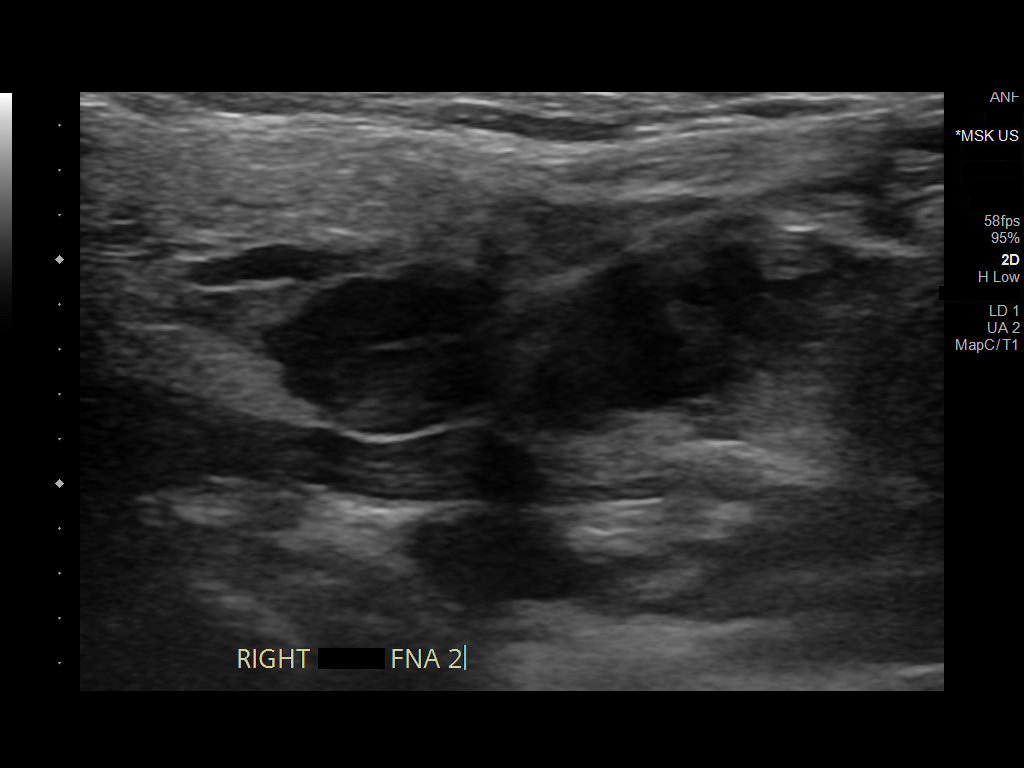
[im 4/7]
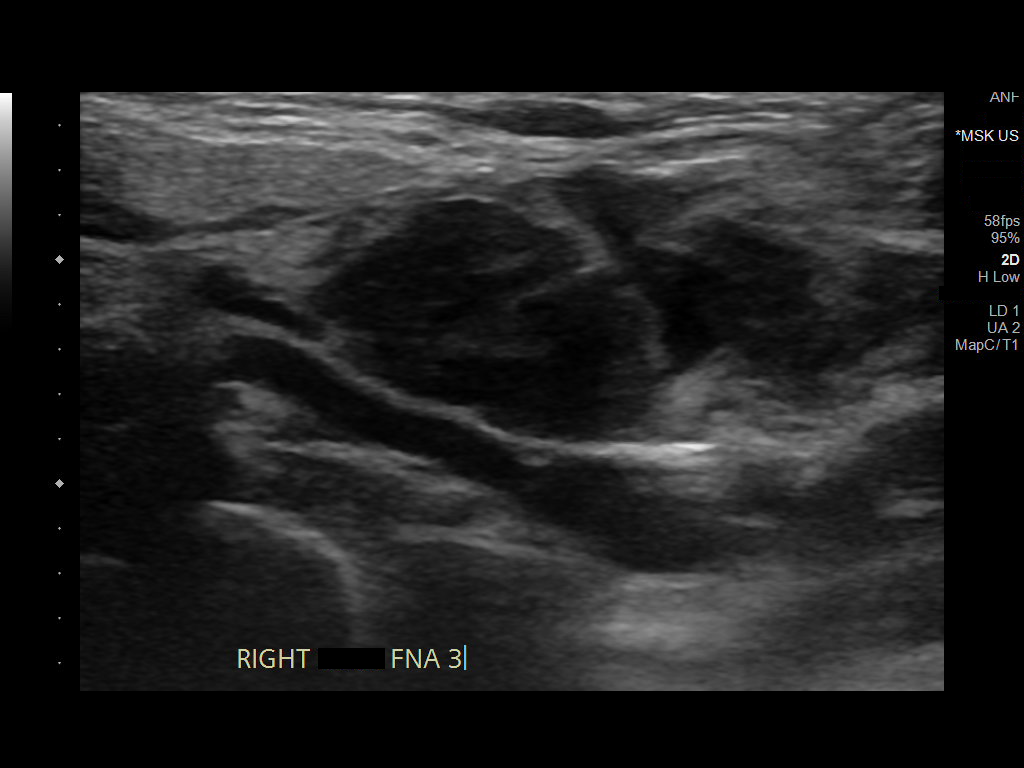
[im 5/7]
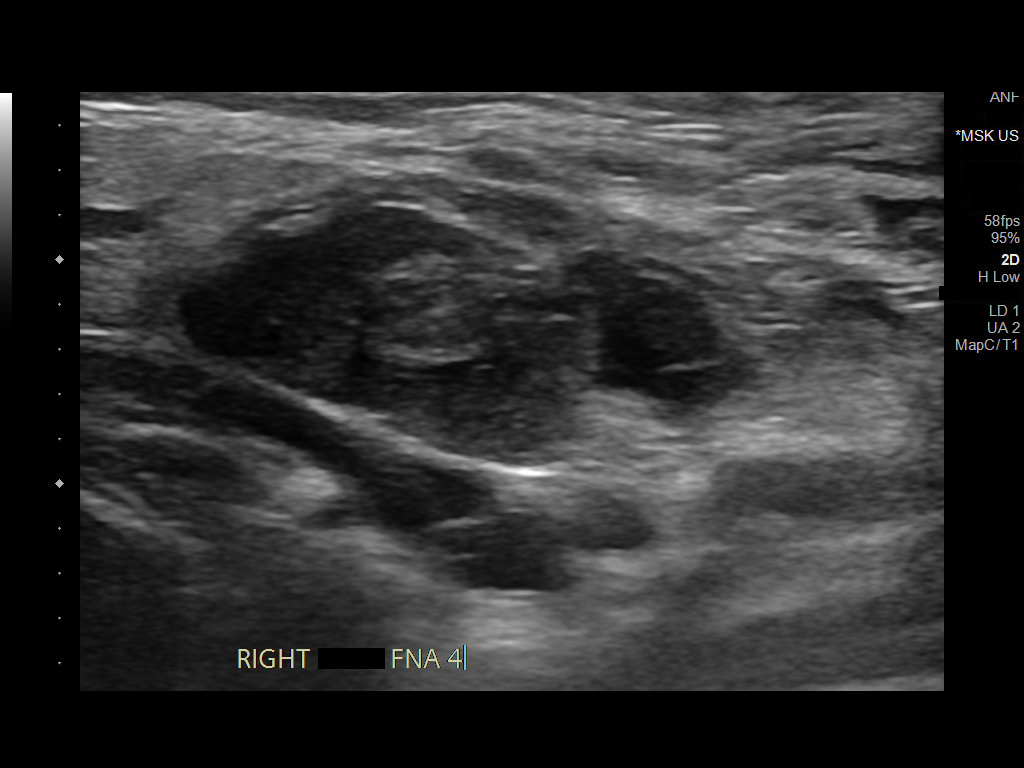
[im 6/7]
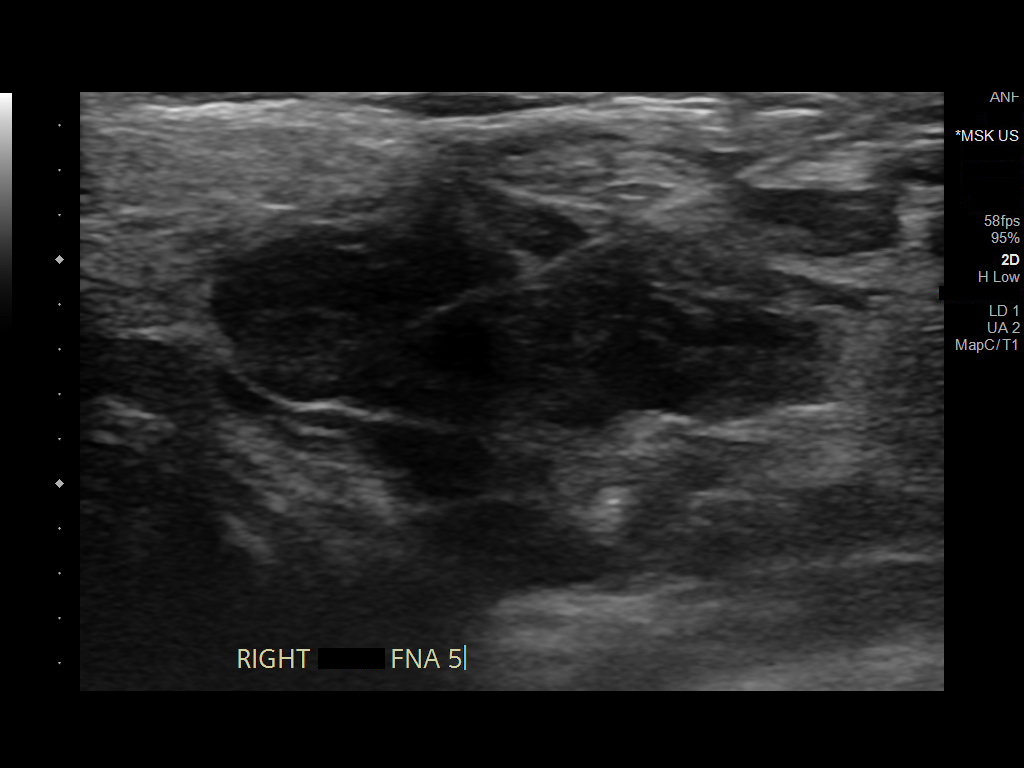
[im 7/7]
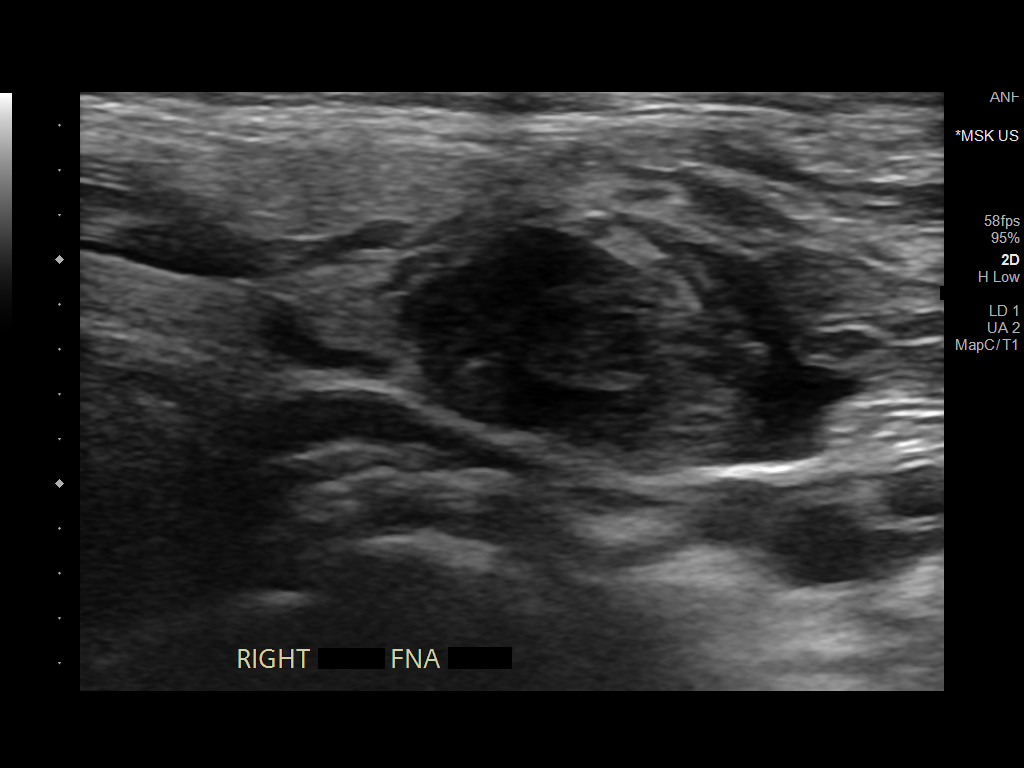

[7 of 7 positions shown; findings below may reference images not displayed]

EXAM:
ULTRASOUND-GUIDED BIOPSY OF RIGHT CERVICAL LYMPH NODE

MEDICATIONS:
None.

ANESTHESIA/SEDATION:
None

FLUOROSCOPY TIME:  None

COMPLICATIONS:
None

PROCEDURE:
Informed written consent was obtained from the patient after a
thorough discussion of the procedural risks, benefits and
alternatives. All questions were addressed. Maximal Sterile Barrier
Technique was utilized including caps, mask, sterile gowns, sterile
gloves, sterile drape, hand hygiene and skin antiseptic. A timeout
was performed prior to the initiation of the procedure.

Patient positioned supine position on the ultrasound table. Scout
images were acquired of the right submandibular region.

The patient is prepped and draped in the usual sterile fashion. 1%
lidocaine was used for local anesthesia. Using ultrasound, multiple
25 gauge FNA were performed of a right cervical lymph node. Specimen
were passed to cytotechnologist for slide preparation.

Final image was acquired.

Sterile bandage was placed.

Patient tolerated the procedure well and remained hemodynamically
stable throughout.

No complications were encountered and no significant blood loss.
IMPRESSION: Status post ultrasound-guided FNA of right cervical lymph node.

## 2021-06-30 NOTE — Telephone Encounter (Signed)
Call received from [facility] with [facility] who advised the pt passed away today Jun 22, 2021 at 11:17am.

## 2021-06-30 DEATH — deceased

## 2021-09-13 IMAGING — US US BIOPSY LYMPH NODE
1 series · 13 of 20 positions shown · non-contrast
Comparison: PET-CT-[DATE]/

MEDICATIONS:
None

ANESTHESIA/SEDATION:
None

COMPLICATIONS:
None immediate.

INDICATION: History of melanoma, now with hypermetabolic right cervical lymph
node. Please ultrasound-guided biopsy for tissue diagnostic
purposes.

History of previous FNA of this nodule however pathology was not
concurrent (oncocytic lesion). As such, patient presents today for
repeat ultrasound-guided core needle biopsy.
EXAM:
ULTRASOUND-GUIDED RIGHT CERVICAL NODE BIOPSY
TECHNIQUE: Informed written consent was obtained from the patient after a
discussion of the risks, benefits and alternatives to treatment.
Questions regarding the procedure were encouraged and answered.
Initial ultrasound scanning demonstrated an approximately 1.9 x
cm right cervical lymph node compatible with the hypermetabolic
lymph node seen on preceding PET-CT image 43, series 605. An
ultrasound image was saved for documentation purposes. The procedure
was planned. A timeout was performed prior to the initiation of the
procedure.

[Series 1: us biopsy lymph node · 13 of 20 slices shown]
[im 1/20]
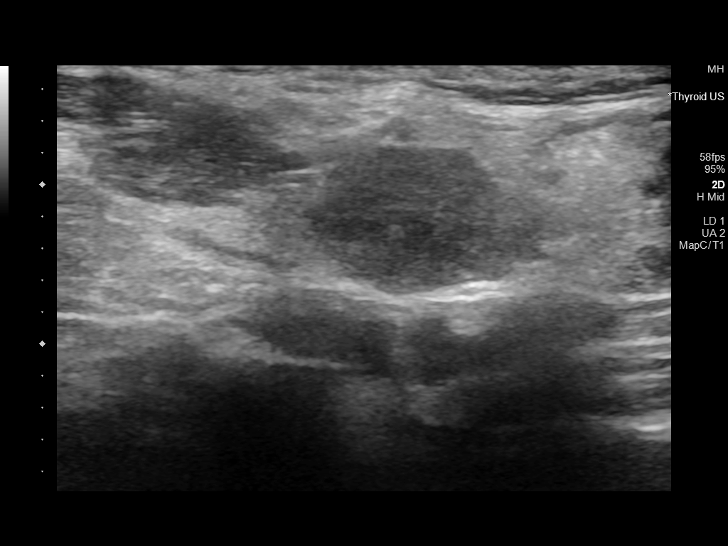
[im 3/20]
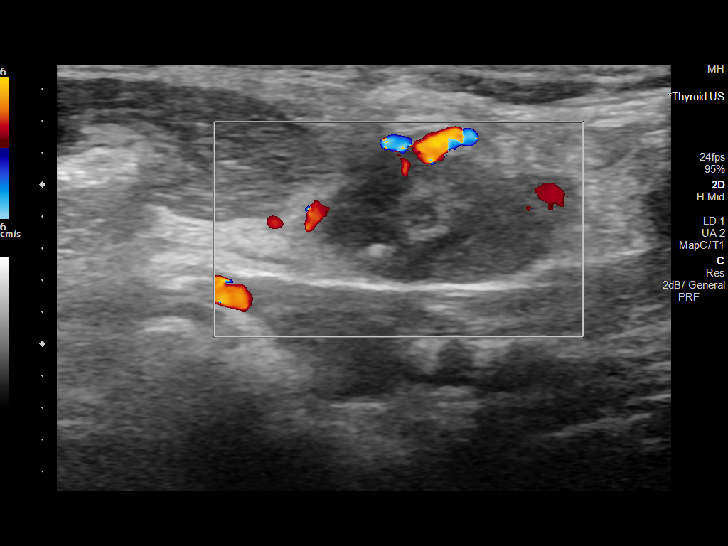
[im 4/20]
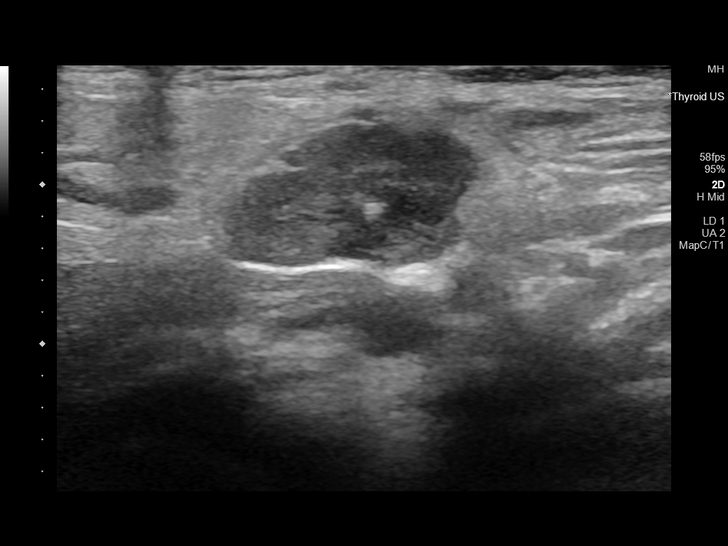
[im 6/20]
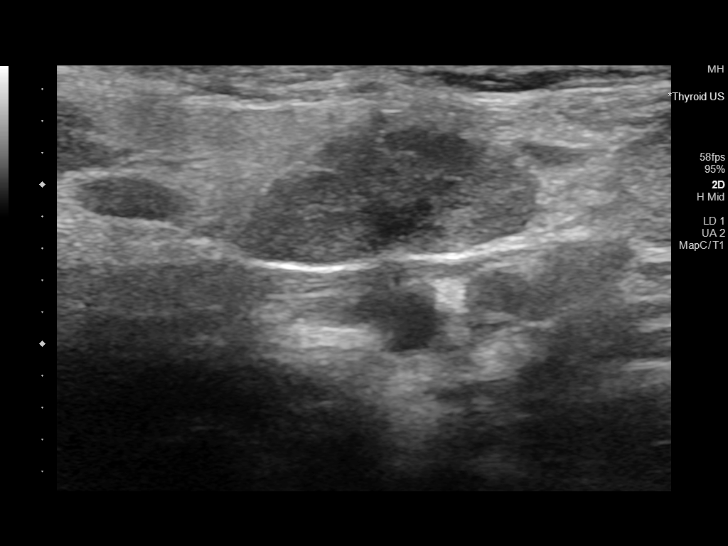
[im 7/20]
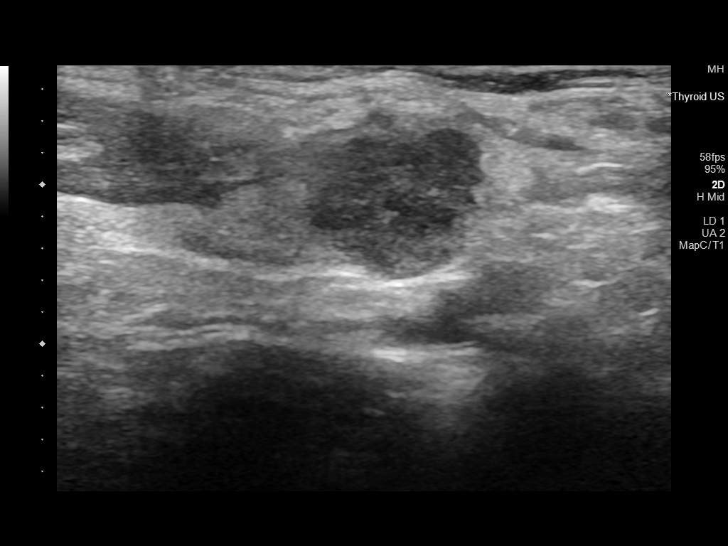
[im 9/20]
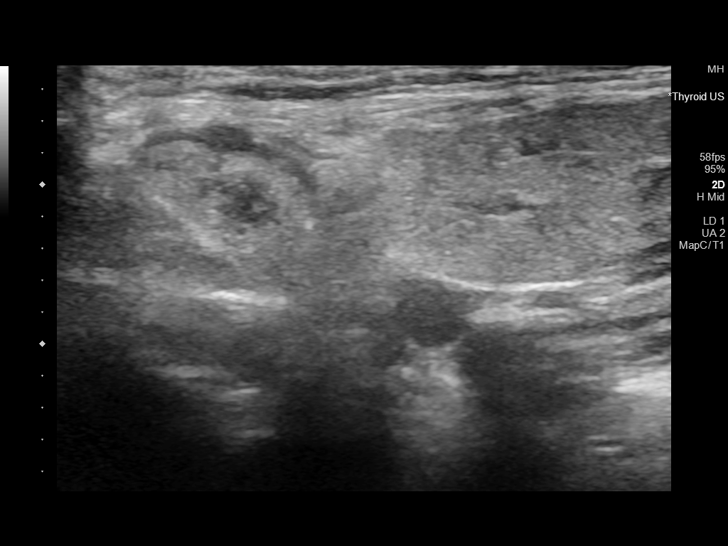
[im 11/20]
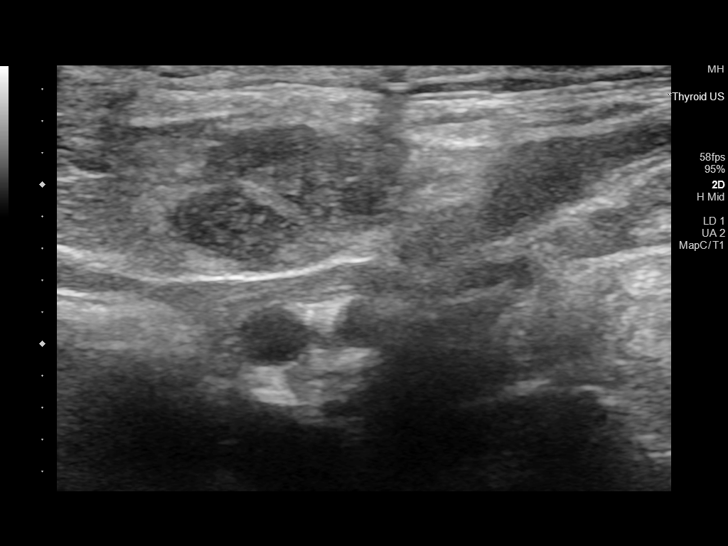
[im 12/20]
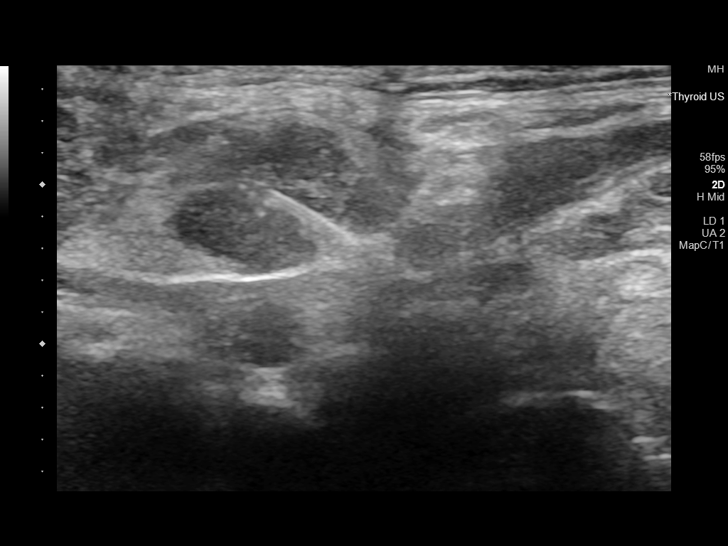
[im 14/20]
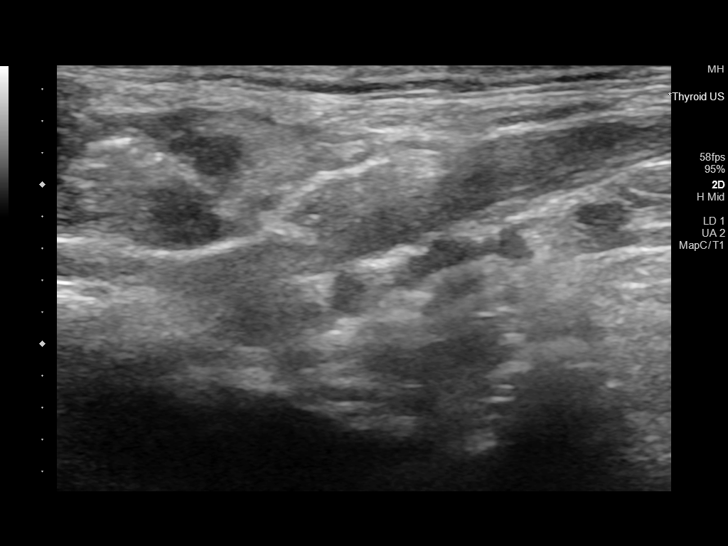
[im 15/20]
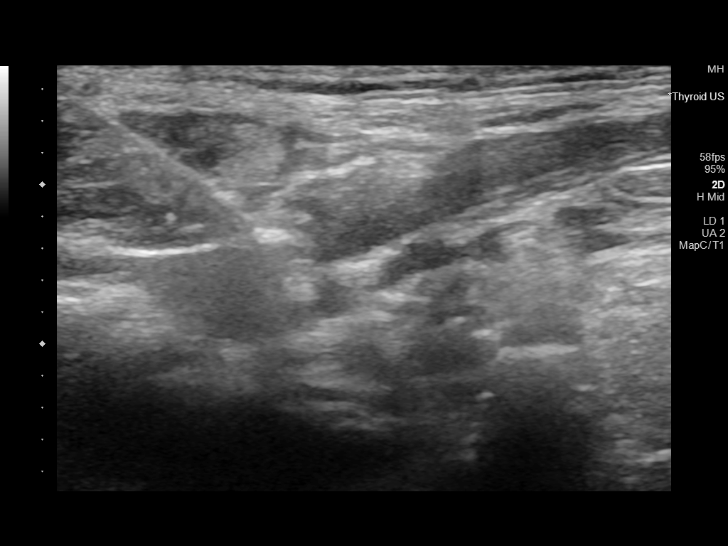
[im 17/20]
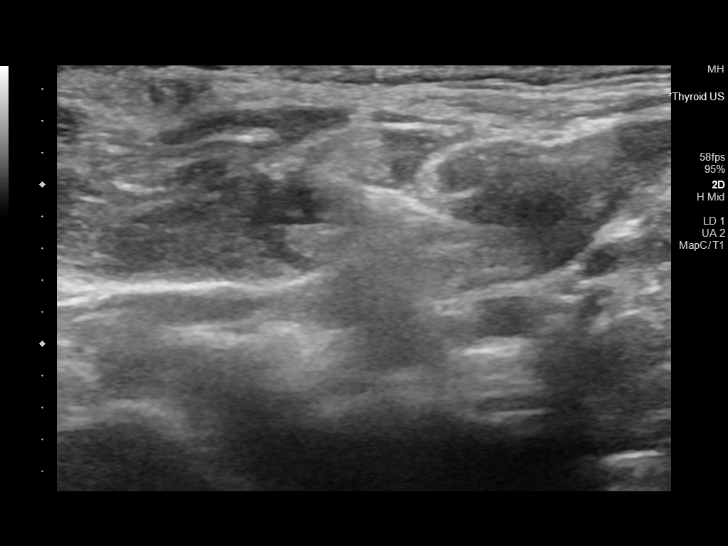
[im 18/20]
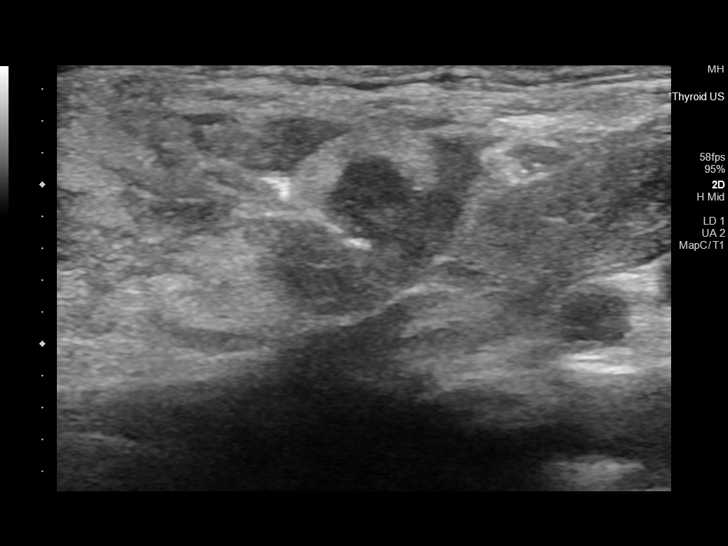
[im 20/20]
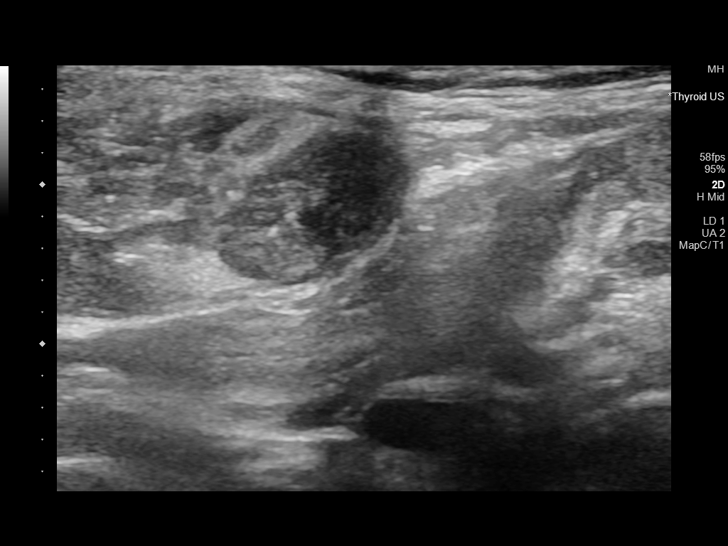

[13 of 20 positions shown; findings below may reference images not displayed]

The operative was prepped and draped in the usual sterile fashion,
and a sterile drape was applied covering the operative field. A
timeout was performed prior to the initiation of the procedure.
Local anesthesia was provided with 1% lidocaine with epinephrine.

Under direct ultrasound guidance, an 18 gauge core needle device was
utilized to obtain to obtain 5 core needle biopsies of the
indeterminate right cervical lymph node.

The samples were placed in saline and submitted to pathology. The
needle was removed and hemostasis was achieved with manual
compression. Post procedure scan was negative for significant
hematoma. A dressing was placed. The patient tolerated the procedure
well without immediate postprocedural complication.
IMPRESSION: Technically repeat successful ultrasound guided core needle biopsy
of dominant indeterminate hypermetabolic right lymph node.
